# Patient Record
Sex: Female | Born: 1955 | Race: Asian | Hispanic: No | Smoking: Never smoker
Health system: Southern US, Community
[De-identification: ages and names within clinical notes are randomized; demographics above are authoritative.]

## PROBLEM LIST (undated history)

## (undated) DIAGNOSIS — R0602 Shortness of breath: Secondary | ICD-10-CM

## (undated) DIAGNOSIS — K2970 Gastritis, unspecified, without bleeding: Secondary | ICD-10-CM

## (undated) DIAGNOSIS — K219 Gastro-esophageal reflux disease without esophagitis: Secondary | ICD-10-CM

## (undated) DIAGNOSIS — E663 Overweight: Secondary | ICD-10-CM

## (undated) DIAGNOSIS — T7840XA Allergy, unspecified, initial encounter: Secondary | ICD-10-CM

## (undated) DIAGNOSIS — R079 Chest pain, unspecified: Secondary | ICD-10-CM

## (undated) DIAGNOSIS — D696 Thrombocytopenia, unspecified: Secondary | ICD-10-CM

## (undated) HISTORY — DX: Chest pain, unspecified: R07.9

## (undated) HISTORY — DX: Gastro-esophageal reflux disease without esophagitis: K21.9

## (undated) HISTORY — DX: Overweight: E66.3

## (undated) HISTORY — DX: Shortness of breath: R06.02

## (undated) HISTORY — DX: Thrombocytopenia, unspecified: D69.6

## (undated) HISTORY — DX: Allergy, unspecified, initial encounter: T78.40XA

## (undated) HISTORY — DX: Gastritis, unspecified, without bleeding: K29.70

---

## 1992-03-27 HISTORY — PX: TUBAL LIGATION: SHX77

## 2002-11-06 ENCOUNTER — Encounter: Payer: Self-pay | Admitting: Family Medicine

## 2002-11-06 ENCOUNTER — Encounter: Admission: RE | Admit: 2002-11-06 | Discharge: 2002-11-06 | Payer: Self-pay | Admitting: Family Medicine

## 2010-07-22 ENCOUNTER — Emergency Department (HOSPITAL_COMMUNITY): Payer: BC Managed Care – PPO

## 2010-07-22 ENCOUNTER — Emergency Department (HOSPITAL_COMMUNITY)
Admission: EM | Admit: 2010-07-22 | Discharge: 2010-07-23 | Disposition: A | Discharge status: Home / Self Care | Payer: BC Managed Care – PPO | Attending: Emergency Medicine | Admitting: Emergency Medicine

## 2010-07-22 DIAGNOSIS — R5381 Other malaise: Secondary | ICD-10-CM | POA: Insufficient documentation

## 2010-07-22 DIAGNOSIS — R630 Anorexia: Secondary | ICD-10-CM | POA: Insufficient documentation

## 2010-07-22 DIAGNOSIS — R0609 Other forms of dyspnea: Secondary | ICD-10-CM | POA: Insufficient documentation

## 2010-07-22 DIAGNOSIS — R5383 Other fatigue: Secondary | ICD-10-CM | POA: Insufficient documentation

## 2010-07-22 DIAGNOSIS — R002 Palpitations: Secondary | ICD-10-CM | POA: Insufficient documentation

## 2010-07-22 DIAGNOSIS — R0989 Other specified symptoms and signs involving the circulatory and respiratory systems: Secondary | ICD-10-CM | POA: Insufficient documentation

## 2010-07-23 LAB — D-DIMER, QUANTITATIVE: D-Dimer, Quant: 0.29 ug/mL-FEU (ref 0.00–0.48)

## 2010-07-23 LAB — POCT CARDIAC MARKERS
CKMB, poc: <1 ng/mL — ABNORMAL LOW (ref 1.0–8.0)
Troponin i, poc: <0.05 ng/mL (ref 0.00–0.09)

## 2010-07-23 LAB — URINALYSIS, ROUTINE W REFLEX MICROSCOPIC
Bilirubin Urine: NEGATIVE
Ketones, ur: NEGATIVE mg/dL
Nitrite: NEGATIVE
Urobilinogen, UA: 0.2 mg/dL (ref 0.0–1.0)

## 2010-07-23 LAB — COMPREHENSIVE METABOLIC PANEL
ALT: 29 U/L (ref 0–35)
Alkaline Phosphatase: 68 U/L (ref 39–117)
BUN: 20 mg/dL (ref 6–23)
Calcium: 9.4 mg/dL (ref 8.4–10.5)
Chloride: 104 mEq/L (ref 96–112)
GFR calc Af Amer: >60 mL/min (ref >60)
GFR calc non Af Amer: >60 mL/min (ref >60)
Glucose, Bld: 104 mg/dL — ABNORMAL HIGH (ref 70–99)

## 2010-07-23 LAB — DIFFERENTIAL
Basophils Relative: 0 % (ref 0–1)
Eosinophils Absolute: 0.2 10*3/uL (ref 0.0–0.7)
Eosinophils Relative: 3 % (ref 0–5)
Monocytes Absolute: 0.7 10*3/uL (ref 0.1–1.0)
Neutro Abs: 3.2 10*3/uL (ref 1.7–7.7)

## 2010-07-23 LAB — CBC
HCT: 43.2 % (ref 36.0–46.0)
Hemoglobin: 14.6 g/dL (ref 12.0–15.0)
MCHC: 33.8 g/dL (ref 30.0–36.0)
Platelets: 139 10*3/uL — ABNORMAL LOW (ref 150–400)
RDW: 13.1 % (ref 11.5–15.5)

## 2011-02-13 ENCOUNTER — Other Ambulatory Visit: Payer: Self-pay | Admitting: Specialist

## 2011-02-13 ENCOUNTER — Ambulatory Visit
Admission: RE | Admit: 2011-02-13 | Discharge: 2011-02-13 | Disposition: A | Discharge status: Home / Self Care | Payer: BC Managed Care – PPO | Source: Ambulatory Visit | Attending: Specialist | Admitting: Specialist

## 2011-02-13 DIAGNOSIS — M79673 Pain in unspecified foot: Secondary | ICD-10-CM

## 2011-08-30 ENCOUNTER — Ambulatory Visit (INDEPENDENT_AMBULATORY_CARE_PROVIDER_SITE_OTHER): Payer: BC Managed Care – PPO | Admitting: Family Medicine

## 2011-08-30 VITALS — BP 121/78 | HR 70 | Temp 97.2°F | Resp 16 | Ht 59.0 in | Wt 121.4 lb

## 2011-08-30 DIAGNOSIS — R1084 Generalized abdominal pain: Secondary | ICD-10-CM

## 2011-08-30 DIAGNOSIS — R109 Unspecified abdominal pain: Secondary | ICD-10-CM

## 2011-08-30 LAB — COMPREHENSIVE METABOLIC PANEL
Alkaline Phosphatase: 105 U/L (ref 39–117)
Creat: 0.54 mg/dL (ref 0.50–1.10)
Glucose, Bld: 96 mg/dL (ref 70–99)
Sodium: 143 mEq/L (ref 135–145)
Total Bilirubin: 0.4 mg/dL (ref 0.3–1.2)
Total Protein: 7.7 g/dL (ref 6.0–8.3)

## 2011-08-30 LAB — POCT CBC
HCT, POC: 47.3 % (ref 37.7–47.9)
Lymph, poc: 3.3 (ref 0.6–3.4)
MCH, POC: 26.8 pg — AB (ref 27–31.2)
MCHC: 31.5 g/dL — AB (ref 31.8–35.4)
MPV: 11.5 fL (ref 0–99.8)
POC Granulocyte: 3.4 (ref 2–6.9)
POC LYMPH PERCENT: 45.3 %L (ref 10–50)
POC MID %: 7.4 %M (ref 0–12)
RDW, POC: 13.5 %
WBC: 7.2 10*3/uL (ref 4.6–10.2)

## 2011-08-30 MED ORDER — SUCRALFATE 1 G PO TABS: ORAL_TABLET | ORAL | Status: DC

## 2011-08-30 MED ORDER — PANTOPRAZOLE SODIUM 40 MG PO TBEC: 40.0000 mg | DELAYED_RELEASE_TABLET | Freq: Every day | ORAL | Status: DC

## 2011-08-30 NOTE — Progress Notes (Signed)
  Subjective:    Patient ID: Beth Brooks, female    DOB: 1955-05-16, 56 y.o.   MRN: [id number]  HPI 56 yo female here with abdominal pain. 1 week of heartburn/epigastric pain.  Burning into chest and throat.  Has also had pain with eating for about a month.  Thinks she has lost 10 pounds.  Eating less because it hurts to eat - pain is epigastric.  Stomach growling a lot too.  Has tried zantac for about a week and hasn't noticed much improvement.  Not hurting right now but always hurts after she eats.  No bowel abnormalities.  No fever.  Occ cough - from itchy throat.  No dysuria.   Has had a history of heartburn before.  THis feels similar to previous episodes but doesn't remember what she was treated with.     Review of Systems Negative except as per HPI     Objective:   Physical Exam  Constitutional: Vital signs are normal. She appears well-developed and well-nourished. She is active.  Cardiovascular: Normal rate, regular rhythm, normal heart sounds and normal pulses.   Pulmonary/Chest: Effort normal and breath sounds normal.  Abdominal: Soft. Normal appearance and bowel sounds are normal. She exhibits no distension and no mass. There is no hepatosplenomegaly. There is no tenderness. There is no rigidity, no rebound, no guarding, no CVA tenderness, no tenderness at McBurney's point and negative Murphy's sign. No hernia.  Neurological: She is alert.    Results for orders placed in visit on 08/30/11  POCT CBC      Component Value Range   WBC 7.2  4.6 - 10.2 (K/uL)   Lymph, poc 3.3  0.6 - 3.4    POC LYMPH PERCENT 45.3  10 - 50 (%L)   MID (cbc) 0.5  0 - 0.9    POC MID % 7.4  0 - 12 (%M)   POC Granulocyte 3.4  2 - 6.9    Granulocyte percent 47.3  37 - 80 (%G)   RBC 5.56 (*) 4.04 - 5.48 (M/uL)   Hemoglobin 14.9  12.2 - 16.2 (g/dL)   HCT, POC 40.9  81.1 - 47.9 (%)   MCV 85.0  80 - 97 (fL)   MCH, POC 26.8 (*) 27 - 31.2 (pg)   MCHC 31.5 (*) 31.8 - 35.4 (g/dL)   RDW, POC 91.4     Platelet  Count, POC 157  142 - 424 (K/uL)   MPV 11.5  0 - 99.8 (fL)         Assessment & Plan:  Gastritis/heartburn affecting appetite.  CBC normal.  CMET pending to eval LFT's.  Start protonix, carafate.  If no improvementin 5-7 days, return for recheck.

## 2011-09-29 ENCOUNTER — Other Ambulatory Visit: Payer: Self-pay | Admitting: Family Medicine

## 2011-12-17 ENCOUNTER — Ambulatory Visit: Payer: BC Managed Care – PPO

## 2011-12-17 ENCOUNTER — Ambulatory Visit (INDEPENDENT_AMBULATORY_CARE_PROVIDER_SITE_OTHER): Payer: BC Managed Care – PPO | Admitting: Family Medicine

## 2011-12-17 VITALS — BP 130/73 | HR 79 | Temp 98.6°F | Resp 18 | Ht 60.0 in | Wt 120.0 lb

## 2011-12-17 DIAGNOSIS — Z789 Other specified health status: Secondary | ICD-10-CM

## 2011-12-17 DIAGNOSIS — R079 Chest pain, unspecified: Secondary | ICD-10-CM

## 2011-12-17 DIAGNOSIS — Z609 Problem related to social environment, unspecified: Secondary | ICD-10-CM

## 2011-12-17 DIAGNOSIS — R109 Unspecified abdominal pain: Secondary | ICD-10-CM

## 2011-12-17 DIAGNOSIS — K219 Gastro-esophageal reflux disease without esophagitis: Secondary | ICD-10-CM

## 2011-12-17 LAB — COMPREHENSIVE METABOLIC PANEL
ALT: 14 U/L (ref 0–35)
AST: 16 U/L (ref 0–37)
Alkaline Phosphatase: 73 U/L (ref 39–117)
CO2: 29 mEq/L (ref 19–32)
Creat: 0.47 mg/dL — ABNORMAL LOW (ref 0.50–1.10)
Sodium: 141 mEq/L (ref 135–145)
Total Bilirubin: 0.7 mg/dL (ref 0.3–1.2)
Total Protein: 7.4 g/dL (ref 6.0–8.3)

## 2011-12-17 LAB — POCT CBC
Hemoglobin: 15.1 g/dL (ref 12.2–16.2)
Lymph, poc: 3 (ref 0.6–3.4)
MCH, POC: 26.7 pg — AB (ref 27–31.2)
MCHC: 30.4 g/dL — AB (ref 31.8–35.4)
MID (cbc): 0.5 (ref 0–0.9)
MPV: 10.9 fL (ref 0–99.8)
POC Granulocyte: 3.2 (ref 2–6.9)
POC MID %: 6.9 %M (ref 0–12)
Platelet Count, POC: 156 10*3/uL (ref 142–424)
RDW, POC: 14.6 %
WBC: 6.7 10*3/uL (ref 4.6–10.2)

## 2011-12-17 MED ORDER — PANTOPRAZOLE SODIUM 40 MG PO TBEC: 40.0000 mg | DELAYED_RELEASE_TABLET | Freq: Every day | ORAL | Status: DC

## 2011-12-17 MED ORDER — GI COCKTAIL ~~LOC~~
30.0000 mL | Freq: Once | ORAL | Status: AC
  Administered 2011-12-17: 30 mL via ORAL

## 2011-12-17 NOTE — Progress Notes (Signed)
[facility] [address] [place] [address] [phone]  Date:  12/17/2011   Name:  Beth Brooks   DOB:  [birth year]   MRN:  [id number]  PCP:  No primary provider on file.    Chief Complaint: Abdominal Pain   History of Present Illness:  Beth Brooks is a 56 y.o. very pleasant female patient who presents with the following:  She was here in June and treated for GERD. She is not currently taking her protonix which was prescribed at that time.  Beth Brooks is here today with pain in her abdomen after eating, as well as chest pains under her left arm.  The chest pains have been present for about 3 weeks.  These pains come and go- they are worse with deep breaths and with eating.  The pains are not associated with exertion After she eats she feels "stong pains" up into her chest and esophagus.   Did not eat yet this morning.   No nausea or vomiting, no diarrhea.    She weighs the same as she did in June of this year, but they think she has lost about 10 pounds over the last year or so.  This seems to be due to eating less.    Beth Brooks has not had a recent physical, and per her daughter's knowledge has never had a mammogram or colonoscopy, and has not seen a doctor for any physical exam or preventative services in the recent past.  She does not speak English, but her daughter is able to help [place] interpret today.  Her daughter is interested in her mother having a complete physical and "test for everything" specifically an abdominal ultrasound.    Menopausal for 2 years.    There is no problem list on file for this patient.   No past medical history on file.  No past surgical history on file.  History  Substance Use Topics  . Smoking status: Never Smoker   . Smokeless tobacco: Not on file  . Alcohol Use: Not on file    No family history on file.  No Known Allergies  Medication list has been reviewed and updated.  Current Outpatient Prescriptions on File Prior to Visit    Medication Sig Dispense Refill  . pantoprazole (PROTONIX) 40 MG tablet Take 1 tablet (40 mg total) by mouth daily.  30 tablet  3  . sucralfate (CARAFATE) 1 G tablet TAKE 1 TAB WITH MEALS AND 1 AT BEDTIME (TOTAL 4 TIMES DAILY) UNTIL GONE.  20 tablet  0    Review of Systems:  As per HPI- otherwise negative.   Physical Examination: Filed Vitals:   12/17/11 1008  BP: 130/73  Pulse: 79  Temp: 98.6 F (37 C)  Resp: 18   Filed Vitals:   12/17/11 1008  Height: 5' (1.524 m)  Weight: 120 lb (54.432 kg)   Body mass index is 23.44 kg/(m^2). Ideal Body Weight: Weight in (lb) to have BMI = 25: 127.7   GEN: WDWN, NAD, Non-toxic, A & O x 3, looks well HEENT: Atraumatic, Normocephalic. Neck supple. No masses, No LAD. Ears and Nose: No external deformity. CV: RRR, No M/G/R. No JVD. No thrill. No extra heart sounds. Cannot reproduce CP by pressing on her chest wall PULM: CTA B, no wheezes, crackles, rhonchi. No retractions. No resp. distress. No accessory muscle use. ABD: S, NT, ND, +BS. No rebound. No HSM. EXTR: No c/c/e NEURO Normal gait.  PSYCH: Normally interactive.  Conversant. Not depressed or anxious appearing.  Calm demeanor.   [facility] reading (PRIMARY) by  Dr. Patsy Lager.  Chest x-ray: negative  EKG: compared to old EKG on file.  No acute change, no ST elevation or depression.  No concerning findings.    GI cocktail: resolved her chest pain while she was in the office today.    Results for orders placed in visit on 12/17/11  POCT CBC      Component Value Range   WBC 6.7  4.6 - 10.2 K/uL   Lymph, poc 3.0  0.6 - 3.4   POC LYMPH PERCENT 44.8  10 - 50 %L   MID (cbc) 0.5  0 - 0.9   POC MID % 6.9  0 - 12 %M   POC Granulocyte 3.2  2 - 6.9   Granulocyte percent 48.3  37 - 80 %G   RBC 5.65 (*) 4.04 - 5.48 M/uL   Hemoglobin 15.1  12.2 - 16.2 g/dL   HCT, POC 16.1 (*) 09.6 - 47.9 %   MCV 87.9  80 - 97 fL   MCH, POC 26.7 (*) 27 - 31.2 pg   MCHC 30.4 (*) 31.8 - 35.4 g/dL   RDW, POC  04.5     Platelet Count, POC 156  142 - 424 K/uL   MPV 10.9  0 - 99.8 fL   increased time needed for this exam due to language barrier.   Assessment and Plan: 1. Abdominal  pain, other specified site  POCT CBC, Comprehensive metabolic panel, gi cocktail (Maalox,Lidocaine,Donnatal), US Abdomen Complete  2. Chest pain  EKG 12-Lead, DG Chest 2 View, gi cocktail (Maalox,Lidocaine,Donnatal)  3. Language Barrier    4. GERD (gastroesophageal reflux disease)  pantoprazole (PROTONIX) 40 MG tablet   Beth Brooks is here with a few symptoms today, but GERD is the likely cause for both her abdominal pain and CP.  Her CP resolved in clinic today after a GI cocktail.  As she has had these abdominal pains for some time and has lost weight, an ultrasound is a reasonable next step.  Will arrange for this test, and also re-start her protonix today.  They are to let me know if she is worse or not getting better with the protonix.  Also explained to her daughter how to arrange for Beth Brooks to have preventative services such as a mammogram and colonoscopy performed.    Abbe Amsterdam, MD

## 2011-12-17 NOTE — Patient Instructions (Addendum)
Take the protonix for heartburn/ abdominal pain.  You can take this daily as needed.    We will schedule you for an ultrasound of your belly and will be in touch with this appointment.    You also need to have a mammogram, colonoscopy, and a complete physical.    You can schedule your own mammogram with several facilities in town such as the [facility] and most OB- GYN offices  You can also schedule your own colonoscopy through a GI office such as [facility] or [facility]

## 2011-12-18 ENCOUNTER — Other Ambulatory Visit: Payer: Self-pay | Admitting: Family Medicine

## 2011-12-18 ENCOUNTER — Other Ambulatory Visit: Payer: Self-pay | Admitting: Radiology

## 2011-12-18 ENCOUNTER — Encounter: Payer: Self-pay | Admitting: Internal Medicine

## 2011-12-18 DIAGNOSIS — K219 Gastro-esophageal reflux disease without esophagitis: Secondary | ICD-10-CM

## 2011-12-18 MED ORDER — PANTOPRAZOLE SODIUM 40 MG PO TBEC: 40.0000 mg | DELAYED_RELEASE_TABLET | Freq: Every day | ORAL | Status: DC

## 2011-12-18 MED ORDER — SUCRALFATE 1 G PO TABS: 1.0000 g | ORAL_TABLET | Freq: Four times a day (QID) | ORAL | Status: DC

## 2011-12-19 ENCOUNTER — Encounter: Payer: Self-pay | Admitting: Family Medicine

## 2011-12-20 ENCOUNTER — Encounter: Payer: Self-pay | Admitting: Internal Medicine

## 2011-12-21 ENCOUNTER — Ambulatory Visit
Admission: RE | Admit: 2011-12-21 | Discharge: 2011-12-21 | Disposition: A | Discharge status: Home / Self Care | Payer: BC Managed Care – PPO | Source: Ambulatory Visit | Attending: Family Medicine | Admitting: Family Medicine

## 2011-12-21 DIAGNOSIS — R109 Unspecified abdominal pain: Secondary | ICD-10-CM

## 2012-01-01 ENCOUNTER — Encounter: Payer: BC Managed Care – PPO | Admitting: Internal Medicine

## 2012-01-15 ENCOUNTER — Encounter: Payer: Self-pay | Admitting: Internal Medicine

## 2012-01-17 ENCOUNTER — Other Ambulatory Visit: Payer: Self-pay | Admitting: Gastroenterology

## 2012-01-17 ENCOUNTER — Ambulatory Visit (INDEPENDENT_AMBULATORY_CARE_PROVIDER_SITE_OTHER): Payer: BC Managed Care – PPO | Admitting: Internal Medicine

## 2012-01-17 ENCOUNTER — Encounter: Payer: Self-pay | Admitting: Internal Medicine

## 2012-01-17 VITALS — BP 118/80 | HR 56 | Ht <= 58 in | Wt 121.6 lb

## 2012-01-17 DIAGNOSIS — K219 Gastro-esophageal reflux disease without esophagitis: Secondary | ICD-10-CM

## 2012-01-17 DIAGNOSIS — Z789 Other specified health status: Secondary | ICD-10-CM

## 2012-01-17 DIAGNOSIS — K3189 Other diseases of stomach and duodenum: Secondary | ICD-10-CM

## 2012-01-17 DIAGNOSIS — Z1211 Encounter for screening for malignant neoplasm of colon: Secondary | ICD-10-CM

## 2012-01-17 DIAGNOSIS — R1013 Epigastric pain: Secondary | ICD-10-CM | POA: Insufficient documentation

## 2012-01-17 MED ORDER — PANTOPRAZOLE SODIUM 40 MG PO TBEC: 40.0000 mg | DELAYED_RELEASE_TABLET | Freq: Every day | ORAL | Status: DC

## 2012-01-17 NOTE — Progress Notes (Signed)
Patient ID: Beth Brooks, female   DOB: 1955-12-28, 56 y.o.   MRN: [id number]  SUBJECTIVE: HPI Beth Brooks is a 56 yo [place] female with little past medical history who is seen in consultation at the request of Dr. Patsy Lager for evaluation of epigastric abdominal pain and GERD.  The patient does not speak English and is accompanied today by her daughter who helps translate.  They report a history of epigastric abdominal pain over the last 2-3 months. This pain is specifically worse after eating and she feels a burning radiation into her mid chest. The pain starts fairly quickly after eating and can last for several hours. She has noted some mild nausea but no vomiting. Her appetite has been somewhat decreased with this issue and she does feel full more quickly. They report an approximate 10 pound weight loss over the last few months. She reports occasionally using ibuprofen but has stopped this now. She was started on pantoprazole 40 mg daily about 2 months ago which has helped but not completely. It seems that she was having more constant pain which the pantoprazole helped, but she still is having pain after eating. She denies change in bowel habits including no blood in her stool nor melena.  No fevers or chills. No known family history of GI illness.  No prior GI procedures  Review of Systems  As per history of present illness, otherwise negative   Past Medical History  Diagnosis Date  . GERD (gastroesophageal reflux disease)     Current Outpatient Prescriptions  Medication Sig Dispense Refill  . pantoprazole (PROTONIX) 40 MG tablet Take 1 tablet (40 mg total) by mouth daily.  30 tablet  3    No Known Allergies  Family History  Problem Relation Age of Onset  . Colon cancer Neg Hx   . Gestational diabetes Daughter     History  Substance Use Topics  . Smoking status: Never Smoker   . Smokeless tobacco: Never Used  . Alcohol Use: No    OBJECTIVE: BP 118/80  Pulse 56  Ht 4\' 9"  (1.448  m)  Wt 121 lb 9.6 oz (55.157 kg)  BMI 26.31 kg/m2 Constitutional: Well-developed and well-nourished. No distress. HEENT: Normocephalic and atraumatic. Oropharynx is clear and moist. No oropharyngeal exudate. Conjunctivae are normal. No scleral icterus. Neck: Neck supple. Trachea midline. Cardiovascular: Normal rate, regular rhythm and intact distal pulses. No M/R/G Pulmonary/chest: Effort normal and breath sounds normal. No wheezing, rales or rhonchi. Abdominal: Soft, nontender, nondistended. Bowel sounds active throughout. There are no masses palpable. No hepatosplenomegaly. Extremities: no clubbing, cyanosis, or edema Lymphadenopathy: No cervical adenopathy noted. Neurological: Alert and oriented to person place and time. Skin: Skin is warm and dry. No rashes noted. Psychiatric: Normal mood and affect. Behavior is normal.  Labs and Imaging -- CBC    Component Value Date/Time   WBC 6.7 12/17/2011 1131   WBC 7.8 07/23/2010 0001   RBC 5.65* 12/17/2011 1131   RBC 5.20* 07/23/2010 0001   HGB 15.1 12/17/2011 1131   HGB 14.6 07/23/2010 0001   HCT 49.7* 12/17/2011 1131   HCT 43.2 07/23/2010 0001   PLT 139* 07/23/2010 0001   MCV 87.9 12/17/2011 1131   MCV 83.1 07/23/2010 0001   MCH 26.7* 12/17/2011 1131   MCH 28.1 07/23/2010 0001   MCHC 30.4* 12/17/2011 1131   MCHC 33.8 07/23/2010 0001   RDW 13.1 07/23/2010 0001   LYMPHSABS 3.7 07/23/2010 0001   MONOABS 0.7 07/23/2010 0001   EOSABS 0.2  07/23/2010 0001   BASOSABS 0.0 07/23/2010 0001    CMP     Component Value Date/Time   NA 141 12/17/2011 1124   K 3.8 12/17/2011 1124   CL 104 12/17/2011 1124   CO2 29 12/17/2011 1124   GLUCOSE 97 12/17/2011 1124   BUN 13 12/17/2011 1124   CREATININE 0.47* 12/17/2011 1124   CREATININE 0.56 07/23/2010 0001   CALCIUM 9.6 12/17/2011 1124   PROT 7.4 12/17/2011 1124   ALBUMIN 4.4 12/17/2011 1124   AST 16 12/17/2011 1124   ALT 14 12/17/2011 1124   ALKPHOS 73 12/17/2011 1124   BILITOT 0.7 12/17/2011 1124   GFRNONAA >60  07/23/2010 0001   GFRAA  Value: >60        The eGFR has been calculated using the MDRD equation. This calculation has not been validated in all clinical situations. eGFR's persistently <60 mL/min signify possible Chronic Kidney Disease. 07/23/2010 0001   Clinical Data:  Abdominal pain and weight loss.   COMPLETE ABDOMINAL ULTRASOUND -- 12/17/2011   Comparison:  None.   Findings:   Gallbladder:  No gallstones, gallbladder wall thickening, or pericholecystic fluid. Negative sonographic Murphy's sign.   Common bile duct:  Normal.  5.3 mm in diameter.   Liver:  Normal.   IVC:  Normal.   Pancreas:  Normal.   Spleen:  Normal.  6.6 cm in length.   Right Kidney:  Normal.  9.6 cm in length.   Left Kidney:  Normal.  9.9 cm in length.   Abdominal aorta:  Normal.  2.0 cm maximum diameter.   IMPRESSION: Negative abdominal ultrasound.   ASSESSMENT AND PLAN:  56 yo [place] female with little past medical history who is seen in consultation at the request of Dr. Patsy Lager for evaluation of epigastric abdominal pain and GERD.  1.  Epigastric pain/dyspepsia/GERD -- the patient's pain seems acid peptic in nature and may relate to gastroduodenitis/ulcer disease or perhaps H. pylori. Her abdominal ultrasound is reviewed and reassuring from a biliary standpoint.  It seems that the once daily PPI has helped, and I recommended that she increase this to twice daily for now. I've also recommended upper endoscopy for further evaluation. We discussed the test today and she is agreeable to proceed, however I am somewhat concerned about the language barrier. We will bring her back for a pre-visit prior to her procedure with an interpreter to ensure there is no confusion and that she is clear as it pertains to the risks and benefits of this procedure. I've asked that she continue to avoid NSAIDs until we know what is causing her symptoms.  2.  CRC screening -- she has never had colonoscopy, and I recommended  this for colorectal cancer screening. She is average risk. Again she will have a previsit to discussed this test before proceeding with an interpreter.

## 2012-01-17 NOTE — Patient Instructions (Addendum)
You have been scheduled for a pre-visit for 01/22/2012 @ 9am to go over your instructions for your Endoscopy/Colonoscopy on 01/31/2012 @ 1:30pm

## 2012-01-22 ENCOUNTER — Ambulatory Visit (AMBULATORY_SURGERY_CENTER): Payer: BC Managed Care – PPO | Admitting: Internal Medicine

## 2012-01-22 DIAGNOSIS — K219 Gastro-esophageal reflux disease without esophagitis: Secondary | ICD-10-CM

## 2012-01-22 DIAGNOSIS — Z1211 Encounter for screening for malignant neoplasm of colon: Secondary | ICD-10-CM

## 2012-01-22 DIAGNOSIS — R1013 Epigastric pain: Secondary | ICD-10-CM

## 2012-01-22 MED ORDER — MOVIPREP 100 G PO SOLR: ORAL | Status: DC

## 2012-01-23 ENCOUNTER — Encounter: Payer: Self-pay | Admitting: Internal Medicine

## 2012-01-25 ENCOUNTER — Encounter: Payer: Self-pay | Admitting: Internal Medicine

## 2012-01-28 ENCOUNTER — Ambulatory Visit: Payer: BC Managed Care – PPO

## 2012-01-28 ENCOUNTER — Ambulatory Visit (INDEPENDENT_AMBULATORY_CARE_PROVIDER_SITE_OTHER): Payer: BC Managed Care – PPO | Admitting: Internal Medicine

## 2012-01-28 VITALS — BP 116/74 | HR 77 | Temp 99.5°F | Resp 16 | Ht 59.5 in | Wt 122.0 lb

## 2012-01-28 DIAGNOSIS — R05 Cough: Secondary | ICD-10-CM

## 2012-01-28 MED ORDER — AZITHROMYCIN 500 MG PO TABS: 500.0000 mg | ORAL_TABLET | Freq: Every day | ORAL | Status: DC

## 2012-01-28 MED ORDER — HYDROCODONE-HOMATROPINE 5-1.5 MG/5ML PO SYRP: 5.0000 mL | ORAL_SOLUTION | Freq: Four times a day (QID) | ORAL | Status: AC | PRN

## 2012-01-28 NOTE — Progress Notes (Signed)
  Subjective:    Patient ID: Beth Brooks, female    DOB: [birth year], 56 y.o.   MRN: [id number]  HPI History of a cough for the last 10 days that is occasionally productive. This has interfered with sleep and caused reduced daytime activity. There is no definite fever or chills according to the daughter who is translating. No recent weight loss. No nasal congestion. No sore throat . Recent history leading up to colonoscopy scheduled for this Wednesday is documented in chart.   Review of Systems No dyspnea on exertion or shortness of breath No orthopnea No palpitations or chest pain No history of underlying pulmonary illness    Objective:   Physical Exam Filed Vitals:   01/28/12 0904  BP: 116/74  Pulse: 77  Temp: 99.5 F (37.5 C)  Resp: 16  Pulse ox 98% Weight 122 pounds Pupils equal round reactive to light and accommodation/no conjunctivitis TMs clear Nares clear Oropharynx clear/no cervical nodes Lungs with rhonchi at both bases posteriorly that clear with deep breathing No wheezing with forced expiration Heart regular without murmur       [facility] reading (PRIMARY) by  Dr. Josephina Gip infiltr   Assessment & Plan:  Problem #1 prolonged cough with no x-ray evidence of underlying disease or tuberculosis This suggests a viral bronchitis or a community-acquired infection such as mycoplasma In lieu of pending colonoscopy Will treat aggressively Meds ordered this encounter  Medications  . azithromycin (ZITHROMAX) 500 MG tablet    Sig: Take 1 tablet (500 mg total) by mouth daily.    Dispense:  3 tablet    Refill:  0  . HYDROcodone-homatropine (HYCODAN) 5-1.5 MG/5ML syrup    Sig: Take 5 mLs by mouth every 6 (six) hours as needed for cough.    Dispense:  120 mL    Refill:  0

## 2012-01-31 ENCOUNTER — Ambulatory Visit (AMBULATORY_SURGERY_CENTER): Payer: BC Managed Care – PPO | Admitting: Internal Medicine

## 2012-01-31 ENCOUNTER — Encounter: Payer: Self-pay | Admitting: Internal Medicine

## 2012-01-31 VITALS — BP 110/72 | HR 71 | Temp 97.6°F | Resp 17 | Ht 59.0 in | Wt 122.0 lb

## 2012-01-31 DIAGNOSIS — Z1211 Encounter for screening for malignant neoplasm of colon: Secondary | ICD-10-CM

## 2012-01-31 DIAGNOSIS — R1013 Epigastric pain: Secondary | ICD-10-CM

## 2012-01-31 DIAGNOSIS — K2980 Duodenitis without bleeding: Secondary | ICD-10-CM

## 2012-01-31 DIAGNOSIS — K2970 Gastritis, unspecified, without bleeding: Secondary | ICD-10-CM

## 2012-01-31 DIAGNOSIS — K2990 Gastroduodenitis, unspecified, without bleeding: Secondary | ICD-10-CM

## 2012-01-31 DIAGNOSIS — K219 Gastro-esophageal reflux disease without esophagitis: Secondary | ICD-10-CM

## 2012-01-31 MED ORDER — SODIUM CHLORIDE 0.9 % IV SOLN: 500.0000 mL | INTRAVENOUS | Status: DC

## 2012-01-31 NOTE — Progress Notes (Signed)
Patient did not experience any of the following events: a burn prior to discharge; a fall within the facility; wrong site/side/patient/procedure/implant event; or a hospital transfer or hospital admission upon discharge from the facility. (G8907) Patient did not have preoperative order for IV antibiotic SSI prophylaxis. (G8918)  

## 2012-01-31 NOTE — Op Note (Signed)
[facility] [address] [place], [address]   ENDOSCOPY PROCEDURE REPORT  PATIENT: Ariel, Laubenstein  MR#: [id number] BIRTHDATE: [birth year] , 55  yrs. old GENDER: Female ENDOSCOPIST: Beverley Fiedler, MD REFERRED BY:  Abbe Amsterdam PROCEDURE DATE:  01/31/2012 PROCEDURE:  EGD w/ biopsy for H.pylori ASA CLASS:     Class II INDICATIONS:  epigastric pain.   dyspepsia. MEDICATIONS: MAC sedation, administered by CRNA and propofol (Diprivan) 200mg  IV TOPICAL ANESTHETIC: Cetacaine Spray  DESCRIPTION OF PROCEDURE: After the risks benefits and alternatives of the procedure were thoroughly explained, informed consent was obtained.  The LB GIF-H180 [id number] endoscope was introduced through the mouth and advanced to the second portion of the duodenum. Without limitations.  The instrument was slowly withdrawn as the mucosa was fully examined.    ESOPHAGUS: The mucosa of the esophagus appeared normal.  STOMACH: Moderate nodular gastritis with erythema (inflammation) was found in the entire examined stomach, most pronounced in the fundus and gastric body.  Biopsies were taken in the proximal and distal stomach.  DUODENUM: Mild duodenal inflammation was found in the duodenal bulb. The duodenal mucosa showed no abnormalities in the 2nd part of the duodenum.  Retroflexed views revealed gastritis.     The scope was then withdrawn from the patient and the procedure completed.  COMPLICATIONS: There were no complications. ENDOSCOPIC IMPRESSION: 1.   The mucosa of the esophagus appeared normal 2.   Gastritis (inflammation) was found in the entire examined stomach; biopsies were taken 3.   Duodenal inflammation was found in the duodenal bulb 4.   The duodenal mucosa showed no abnormalities in the 2nd part of the duodenum  RECOMMENDATIONS: 1.  Await pathology results 2.  Continue current medications, including twice daily pantoprazole 40 mg for now 3.  Follow-up of helicobacter  pylori status, treat if indicated 4.  Avoid NSAIDS  eSigned:  Beverley Fiedler, MD 01/31/2012 2:04 PM    CC:The Patient

## 2012-01-31 NOTE — Op Note (Signed)
[facility] [address] [place], [address]   COLONOSCOPY PROCEDURE REPORT  PATIENT: Beth, Brooks  MR#: [id number] BIRTHDATE: 07-10-1955 , 55  yrs. old GENDER: Female ENDOSCOPIST: Beverley Fiedler, MD PROCEDURE DATE:  01/31/2012 PROCEDURE:   Colonoscopy, screening ASA CLASS:   Class II INDICATIONS:average risk screening and first colonoscopy. MEDICATIONS: MAC sedation, administered by CRNA and propofol (Diprivan) 100mg  IV  DESCRIPTION OF PROCEDURE:   After the risks benefits and alternatives of the procedure were thoroughly explained, informed consent was obtained.  A digital rectal exam revealed no rectal mass.   The LB CF-[id number]  endoscope was introduced through the anus and advanced to the terminal ileum which was intubated for a short distance. No adverse events experienced.   The quality of the prep was good, using MoviPrep  The instrument was then slowly withdrawn as the colon was fully examined.   COLON FINDINGS: The mucosa appeared normal in the terminal ileum. A normal appearing cecum, ileocecal valve, and appendiceal orifice were identified.  The ascending, hepatic flexure, transverse, splenic flexure, descending, sigmoid colon and rectum appeared unremarkable.  No polyps or cancers were seen.  Retroflexed views revealed internal hemorrhoids. The time to cecum=2 minutes 12 seconds.  Withdrawal time=8 minutes 09 seconds.  The scope was withdrawn and the procedure completed.  COMPLICATIONS: There were no complications.  ENDOSCOPIC IMPRESSION: 1.   Normal mucosa in the terminal ileum 2.   Normal colon 3.   Small internal hemorrhoids  RECOMMENDATIONS: You should continue to follow colorectal cancer screening guidelines for "routine risk" patients with a repeat colonoscopy in 10 years. There is no need for FOBT (stool) testing for at least 5 years.   eSigned:  Beverley Fiedler, MD 01/31/2012 2:06 PM   cc: The Patient and Abbe Amsterdam,  MD

## 2012-01-31 NOTE — Progress Notes (Signed)
INTERPRETER PRESENT DURING ENTIRE RECOVERY STAGE. PATIENT TO BATHROOM PRIOR TO DC.

## 2012-01-31 NOTE — Patient Instructions (Signed)
HIGH FIBER DIET WITH LIBERAL FLUID INTAKE. ADD METAMUCIL OR BENEFIBER. CONTINUE YOUR MEDICATIONS INCLUDING PANTOPRAZOLE 40 MG TWICE DAILY. AVOID ANTI INFLAMMATORIES: MEDICATIONS LIKE MOTRIN, ALEVE, IBUPROFEN.  YOU HAD AN ENDOSCOPIC PROCEDURE TODAY AT THE [facility]: Refer to the procedure report that was given to you for any specific questions about what was found during the examination.  If the procedure report does not answer your questions, please call your gastroenterologist to clarify.  If you requested that your care partner not be given the details of your procedure findings, then the procedure report has been included in a sealed envelope for you to review at your convenience later.  YOU SHOULD EXPECT: Some feelings of bloating in the abdomen. Passage of more gas than usual.  Walking can help get rid of the air that was put into your GI tract during the procedure and reduce the bloating. If you had a lower endoscopy (such as a colonoscopy or flexible sigmoidoscopy) you may notice spotting of blood in your stool or on the toilet paper. If you underwent a bowel prep for your procedure, then you may not have a normal bowel movement for a few days.  DIET: Your first meal following the procedure should be a light meal and then it is ok to progress to your normal diet.  A half-sandwich or bowl of soup is an example of a good first meal.  Heavy or fried foods are harder to digest and may make you feel nauseous or bloated.  Likewise meals heavy in dairy and vegetables can cause extra gas to form and this can also increase the bloating.  Drink plenty of fluids but you should avoid alcoholic beverages for 24 hours.  ACTIVITY: Your care partner should take you home directly after the procedure.  You should plan to take it easy, moving slowly for the rest of the day.  You can resume normal activity the day after the procedure however you should NOT DRIVE or use heavy machinery for 24 hours  (because of the sedation medicines used during the test).    SYMPTOMS TO REPORT IMMEDIATELY: A gastroenterologist can be reached at any hour.  During normal business hours, 8:30 AM to 5:00 PM Monday through Friday, call [phone].  After hours and on weekends, please call the GI answering service at [phone] who will take a message and have the physician on call contact you.   Following lower endoscopy (colonoscopy or flexible sigmoidoscopy):  Excessive amounts of blood in the stool  Significant tenderness or worsening of abdominal pains  Swelling of the abdomen that is new, acute  Fever of 100F or higher  Following upper endoscopy (EGD)  Vomiting of blood or coffee ground material  New chest pain or pain under the shoulder blades  Painful or persistently difficult swallowing  New shortness of breath  Fever of 100F or higher  Black, tarry-looking stools  FOLLOW UP: If any biopsies were taken you will be contacted by phone or by letter within the next 1-3 weeks.  Call your gastroenterologist if you have not heard about the biopsies in 3 weeks.  Our staff will call the home number listed on your records the next business day following your procedure to check on you and address any questions or concerns that you may have at that time regarding the information given to you following your procedure. This is a courtesy call and so if there is no answer at the home number and we have not  heard from you through the emergency physician on call, we will assume that you have returned to your regular daily activities without incident.  SIGNATURES/CONFIDENTIALITY: You and/or your care partner have signed paperwork which will be entered into your electronic medical record.  These signatures attest to the fact that that the information above on your After Visit Summary has been reviewed and is understood.  Full responsibility of the confidentiality of this discharge information lies with you  and/or your care-partner.

## 2012-02-01 ENCOUNTER — Telehealth: Payer: Self-pay | Admitting: *Deleted

## 2012-02-01 NOTE — Telephone Encounter (Signed)
  Follow up Call-  Call back number 01/31/2012  Post procedure Call Back phone  # [phone]  Permission to leave phone message Yes     No answer, left message to call if questions or concerns.

## 2012-02-02 ENCOUNTER — Other Ambulatory Visit: Payer: Self-pay | Admitting: Gastroenterology

## 2012-02-02 DIAGNOSIS — Z789 Other specified health status: Secondary | ICD-10-CM

## 2012-02-02 DIAGNOSIS — K219 Gastro-esophageal reflux disease without esophagitis: Secondary | ICD-10-CM

## 2012-02-02 MED ORDER — PANTOPRAZOLE SODIUM 40 MG PO TBEC: 40.0000 mg | DELAYED_RELEASE_TABLET | Freq: Every day | ORAL | Status: DC

## 2012-02-05 ENCOUNTER — Encounter: Payer: Self-pay | Admitting: Internal Medicine

## 2012-02-05 NOTE — Progress Notes (Signed)
Seen by RN.

## 2012-02-07 ENCOUNTER — Encounter: Payer: Self-pay | Admitting: Internal Medicine

## 2012-02-13 ENCOUNTER — Other Ambulatory Visit: Payer: Self-pay | Admitting: Internal Medicine

## 2012-02-14 ENCOUNTER — Other Ambulatory Visit: Payer: Self-pay | Admitting: *Deleted

## 2012-02-21 ENCOUNTER — Other Ambulatory Visit: Payer: Self-pay | Admitting: Internal Medicine

## 2012-02-21 NOTE — Telephone Encounter (Signed)
Ok to refil x 1 Then F/U if not well 2 weeks

## 2012-02-21 NOTE — Telephone Encounter (Signed)
Daughter called regarding a refill for Pt's Hycodan. She states that her mother is still coughing. Eileen Stanford

## 2012-02-22 ENCOUNTER — Telehealth: Payer: Self-pay

## 2012-02-22 MED ORDER — HYDROCODONE-HOMATROPINE 5-1.5 MG/5ML PO SYRP: ORAL_SOLUTION | ORAL | Status: DC

## 2012-02-22 NOTE — Addendum Note (Signed)
Addended by: Fidel Levy on: 02/22/2012 09:52 AM   Modules accepted: Orders

## 2012-02-22 NOTE — Telephone Encounter (Signed)
Hycodan called to [facility].

## 2012-02-22 NOTE — Telephone Encounter (Signed)
LMOM to CB. 

## 2012-02-22 NOTE — Telephone Encounter (Signed)
Done IN ERROR.  MBC

## 2012-03-06 ENCOUNTER — Encounter: Payer: Self-pay | Admitting: Internal Medicine

## 2012-03-08 ENCOUNTER — Encounter: Payer: Self-pay | Admitting: Internal Medicine

## 2012-03-08 ENCOUNTER — Ambulatory Visit (INDEPENDENT_AMBULATORY_CARE_PROVIDER_SITE_OTHER): Payer: BC Managed Care – PPO | Admitting: Internal Medicine

## 2012-03-08 VITALS — BP 90/62 | HR 72 | Ht 59.0 in | Wt 120.0 lb

## 2012-03-08 DIAGNOSIS — Z789 Other specified health status: Secondary | ICD-10-CM

## 2012-03-08 DIAGNOSIS — K2990 Gastroduodenitis, unspecified, without bleeding: Secondary | ICD-10-CM

## 2012-03-08 DIAGNOSIS — K219 Gastro-esophageal reflux disease without esophagitis: Secondary | ICD-10-CM

## 2012-03-08 DIAGNOSIS — Z609 Problem related to social environment, unspecified: Secondary | ICD-10-CM

## 2012-03-08 MED ORDER — SUCRALFATE 1 G PO TABS: 1.0000 g | ORAL_TABLET | Freq: Four times a day (QID) | ORAL | Status: DC

## 2012-03-08 MED ORDER — PANTOPRAZOLE SODIUM 40 MG PO TBEC: 40.0000 mg | DELAYED_RELEASE_TABLET | Freq: Every day | ORAL | Status: DC

## 2012-03-08 NOTE — Progress Notes (Signed)
  Subjective:    Patient ID: Beth Brooks, female    DOB: 1955/09/05, 56 y.o.   MRN: [id number]  HPI Beth Brooks is a 56 yo female  Who seen in followup for epigastric abdominal pain and GERD. She'll come by her daughter who helps with translation. She underwent upper endoscopy on 01/31/2012 which revealed a normal esophagus, gastroduodenitis, biopsies were negative for H. pylori, dysplasia or malignancy. She was started on pantoprazole 40 mg daily and returns to discuss her symptoms. Overall she is improved with less epigastric pain, and improvement heartburn. She does still feel occasional epigastric discomfort after eating a full meal. She's not having nausea or vomiting. She has run out of pantoprazole. No change in her bowel habits, melena or rectal bleeding.   Review of Systems As per history of present illness, otherwise negative  Current Medications, Allergies, Past Medical History, Past Surgical History, Family History and Social History were reviewed in [employer] record.     Objective:   Physical Exam BP 90/62  Pulse 72  Ht 4\' 11"  (1.499 m)  Wt 120 lb (54.432 kg)  BMI 24.24 kg/m2 Constitutional: Well-developed and well-nourished. No distress. HEENT: Normocephalic and atraumatic. Oropharynx is clear and moist. No oropharyngeal exudate. Conjunctivae are normal.  No scleral icterus. Neck: Neck supple. Trachea midline. Cardiovascular: Normal rate, regular rhythm and intact distal pulses. No M/R/G Pulmonary/chest: Effort normal and breath sounds normal. No wheezing, rales or rhonchi. Abdominal: Soft, nontender, nondistended. Bowel sounds active throughout. There are no masses palpable. No hepatosplenomegaly. Extremities: no clubbing, cyanosis, or edema Lymphadenopathy: No cervical adenopathy noted. Neurological: Alert and oriented to person place and time. Skin: Skin is warm and dry. No rashes noted. Psychiatric: Normal mood and affect. Behavior is normal.      Assessment & Plan:   56 yo female  Who seen in followup for epigastric abdominal pain and GERD.  1.  GERD/gastroduodenitis -- she had a favorable response to pantoprazole 40 mg daily and I have recommended she continue with this medication.  She is still having some epigastric pain with eating full meals, and I recommended smaller more frequent meals.  She did have an ultrasound performed in September which was negative for any biliary pathology.  I will add Carafate 1 g 4 times a day when necessary to see if this helps additionally with her pain, which is felt most likely secondary to her stomach and proximal duodenal inflammation.  This likely will improve further with pantoprazole.  Other considerations include gastroparesis, but they're a little risk factors for this. I will see her back in clinic in 3 months to reassess her symptoms ensure improvement.

## 2012-03-08 NOTE — Patient Instructions (Addendum)
We have sent the following medications to your pharmacy for you to pick up at your convenience: Protonix, Carafate; please take as directed.  Follow up with Dr. Rhea Belton in 3 months

## 2012-08-29 ENCOUNTER — Other Ambulatory Visit: Payer: Self-pay | Admitting: Internal Medicine

## 2013-03-06 ENCOUNTER — Other Ambulatory Visit: Payer: Self-pay | Admitting: Internal Medicine

## 2013-09-29 ENCOUNTER — Other Ambulatory Visit: Payer: Self-pay | Admitting: Internal Medicine

## 2014-03-21 ENCOUNTER — Other Ambulatory Visit: Payer: Self-pay | Admitting: Internal Medicine

## 2014-04-15 ENCOUNTER — Other Ambulatory Visit: Payer: Self-pay | Admitting: Internal Medicine

## 2014-04-25 ENCOUNTER — Ambulatory Visit (INDEPENDENT_AMBULATORY_CARE_PROVIDER_SITE_OTHER): Payer: BLUE CROSS/BLUE SHIELD | Admitting: Family Medicine

## 2014-04-25 ENCOUNTER — Other Ambulatory Visit: Payer: Self-pay | Admitting: Family Medicine

## 2014-04-25 VITALS — BP 122/82 | HR 65 | Temp 98.3°F | Resp 18

## 2014-04-25 DIAGNOSIS — J069 Acute upper respiratory infection, unspecified: Secondary | ICD-10-CM

## 2014-04-25 DIAGNOSIS — R05 Cough: Secondary | ICD-10-CM

## 2014-04-25 DIAGNOSIS — J208 Acute bronchitis due to other specified organisms: Secondary | ICD-10-CM

## 2014-04-25 DIAGNOSIS — R059 Cough, unspecified: Secondary | ICD-10-CM

## 2014-04-25 DIAGNOSIS — K219 Gastro-esophageal reflux disease without esophagitis: Secondary | ICD-10-CM

## 2014-04-25 MED ORDER — IPRATROPIUM BROMIDE 0.03 % NA SOLN: NASAL | Status: DC

## 2014-04-25 MED ORDER — HYDROCODONE-HOMATROPINE 5-1.5 MG/5ML PO SYRP: ORAL_SOLUTION | ORAL | Status: DC

## 2014-04-25 MED ORDER — PANTOPRAZOLE SODIUM 40 MG PO TBEC: DELAYED_RELEASE_TABLET | ORAL | Status: DC

## 2014-04-25 MED ORDER — BENZONATATE 100 MG PO CAPS: 100.0000 mg | ORAL_CAPSULE | Freq: Three times a day (TID) | ORAL | Status: DC | PRN

## 2014-04-25 NOTE — Progress Notes (Signed)
Subjective: 59 year old lady who is here with a four-day history of cough and congestion. She has a sore throat. She's been having chills. She does not smoke. She does have a long history of problems with her stomach. A couple of years ago she was on medicine for this with pantoprazole and spoke with 8. She has been out of that medicine and has more pain there and would like to be back on her medicine. She was last seen by gastroenterologist a little over 2 years ago.  Objective: Pleasant alert lady in no major distress. She does not speak much [place], but her nephew was here helping to interpret for her. Her TMs are normal. She feels stuffy in her years and itchy in the ears but not painful. Her throat is mildly red with no exudate. Neck supple without significant nodes. Chest is clear to auscultation. Heart regular without murmurs.  Assessment: Viral pharyngitis and bronchitis Dyspepsia  Plan: Put her back on the pantoprazole. Not sure that both that and saw Joanne Chars needed at this time. We'll just see how she does with the pantoprazole.  Treat the cough and postnasal drainage.  Return if not improving

## 2014-04-25 NOTE — Patient Instructions (Signed)
Drink lots of water and tea and get enough rest  Take the cough syrup 1 teaspoon every 4-6 hours primarily at night or on weekends.   Use the cough pills when she goes back to work because they will not make her sleepy.  Use the nose spray 1 or 2 sprays in each side of the nose up to 4 times daily if needed for head congestion  Take the pantoprazole 1 each day for the stomach  Return if not improving or if running high fever or getting worse rather than better.

## 2014-04-27 NOTE — Telephone Encounter (Signed)
Dr Linna Darner, you just saw pt for GERD, but this med hasn't been Rxd by [place] for a while. Do you want to RF? See note from pharm that this was on pt's DC papers.

## 2014-04-28 NOTE — Telephone Encounter (Signed)
Call patient: I would like to see how she does on just the pansoprasole and no sulcrafate for a while.  If not improved in the next 2 weeks call back and we will give the sulcrafate if necessary.

## 2014-04-28 NOTE — Telephone Encounter (Signed)
Notified daughter of Dr Hopper's instr's. She agreed to explain to pt and will CB if needed.

## 2014-06-02 ENCOUNTER — Encounter: Payer: Self-pay | Admitting: Family Medicine

## 2014-06-02 NOTE — Progress Notes (Signed)
Put the disability forms in the mail the patient. They were sent to the [facility] in error.

## 2014-08-25 IMAGING — US US ABDOMEN COMPLETE
1 series · 14 of 25 positions shown · non-contrast
Comparison: None.

CLINICAL DATA: Abdominal pain and weight loss.

COMPLETE ABDOMINAL ULTRASOUND

[Series 1: us abdomen complete · 0.19mm/px · 14 of 83 slices shown]
[im 1/83]
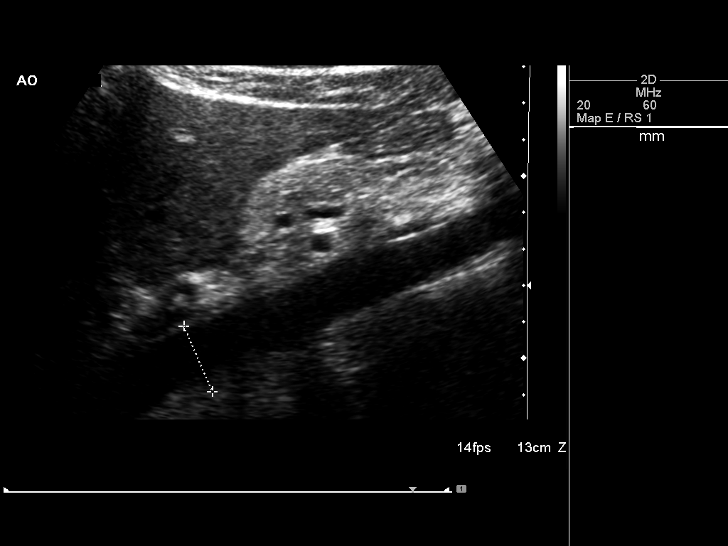
[im 7/83]
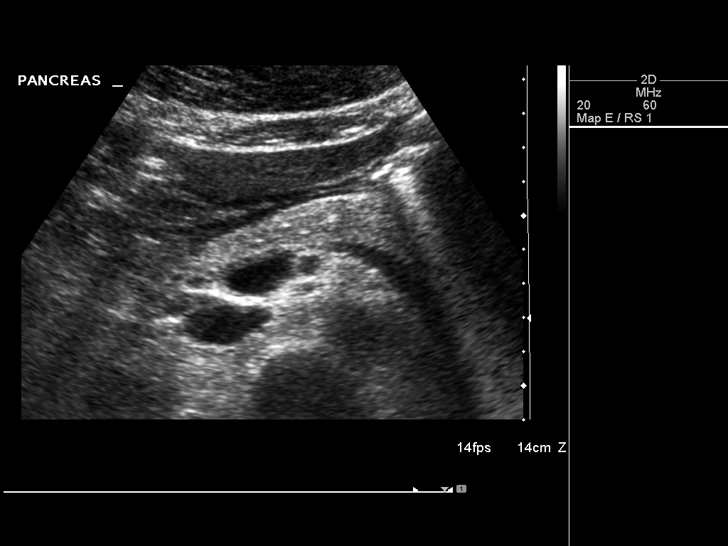
[im 14/83]
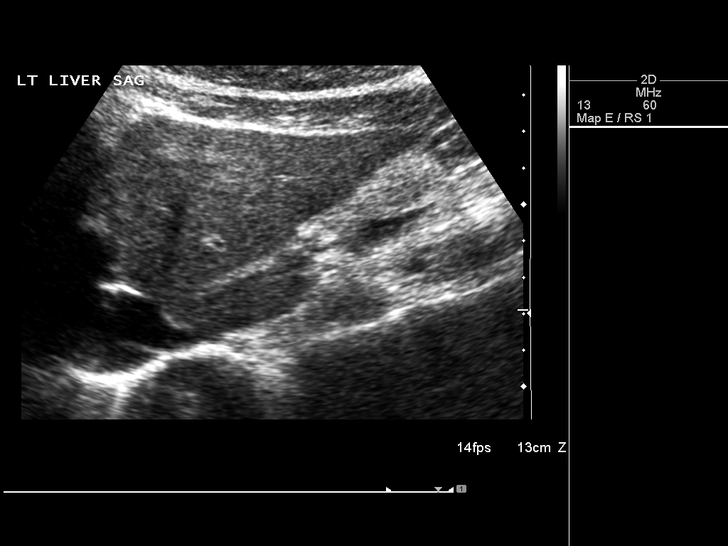
[im 21/83]
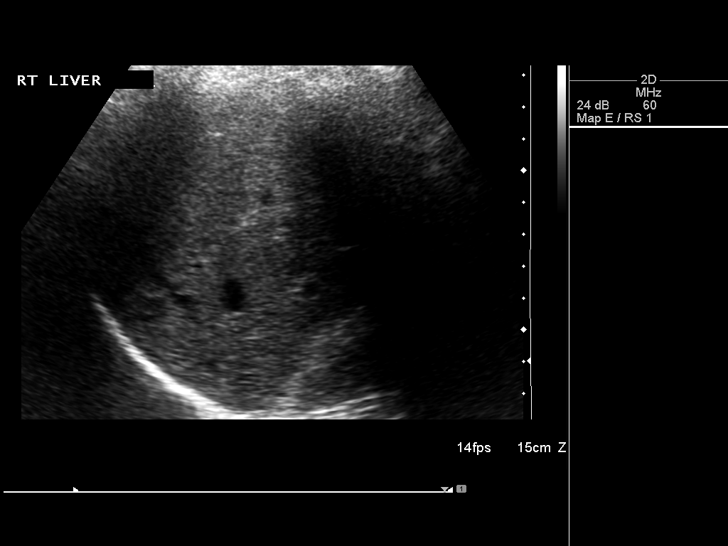
[im 28/83]
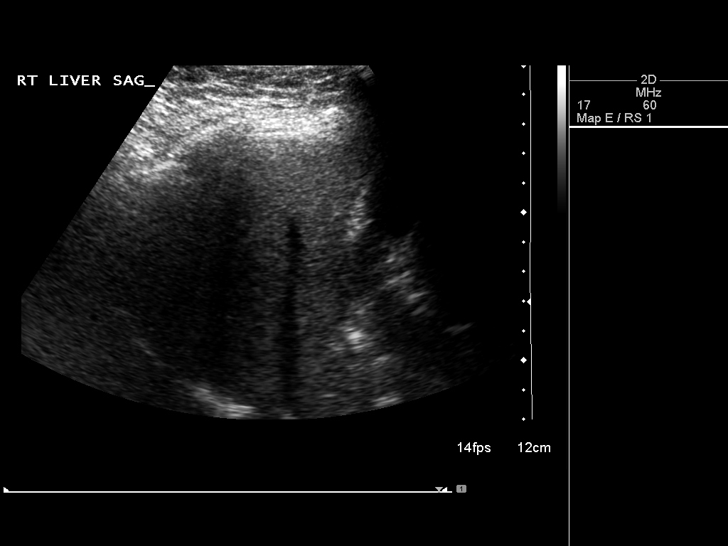
[im 31/83]
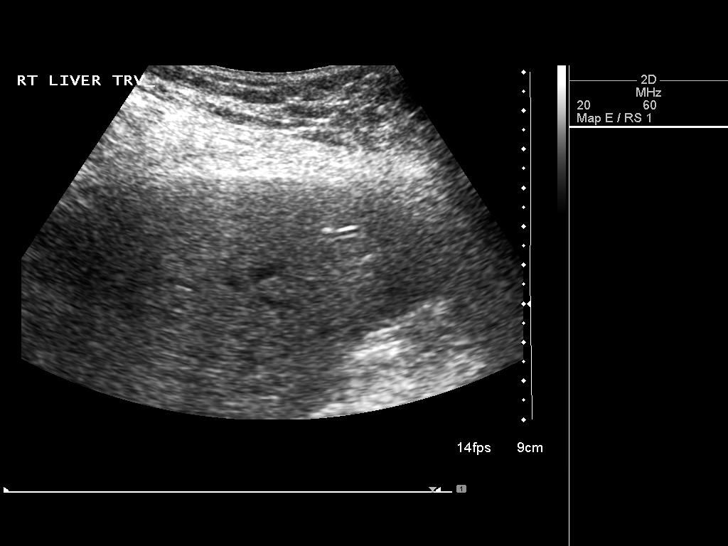
[im 38/83]
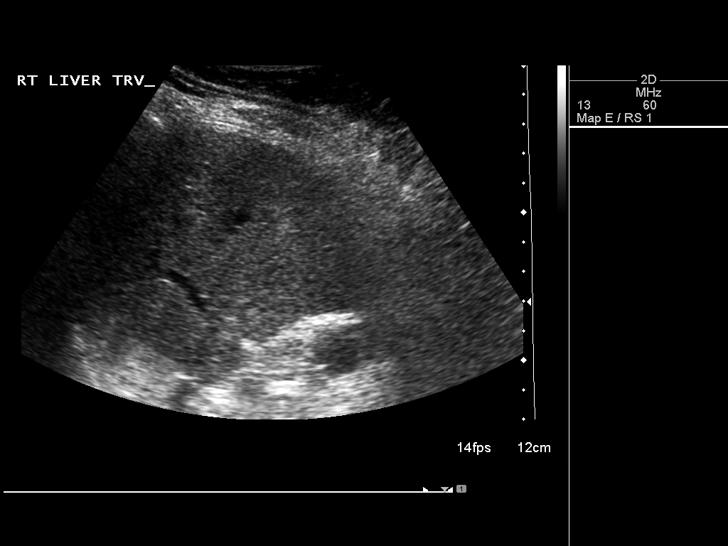
[im 45/83]
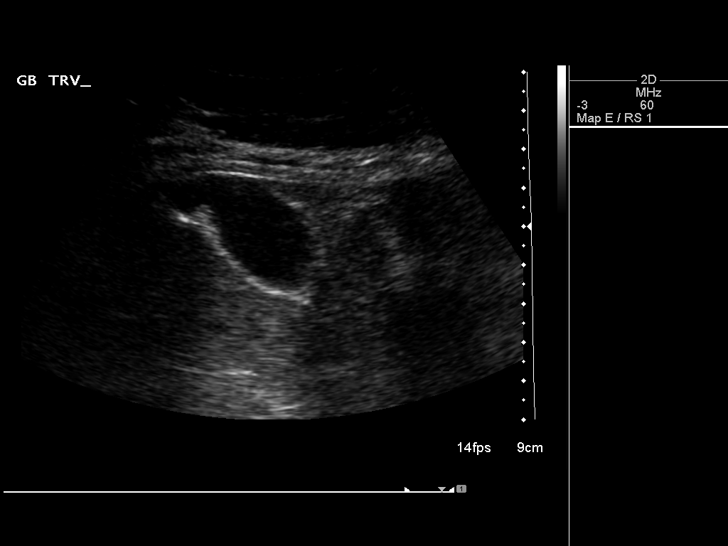
[im 52/83]
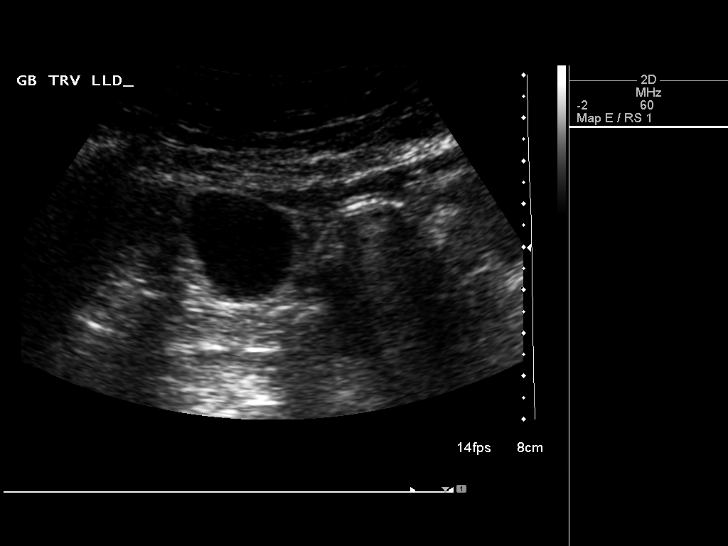
[im 55/83]
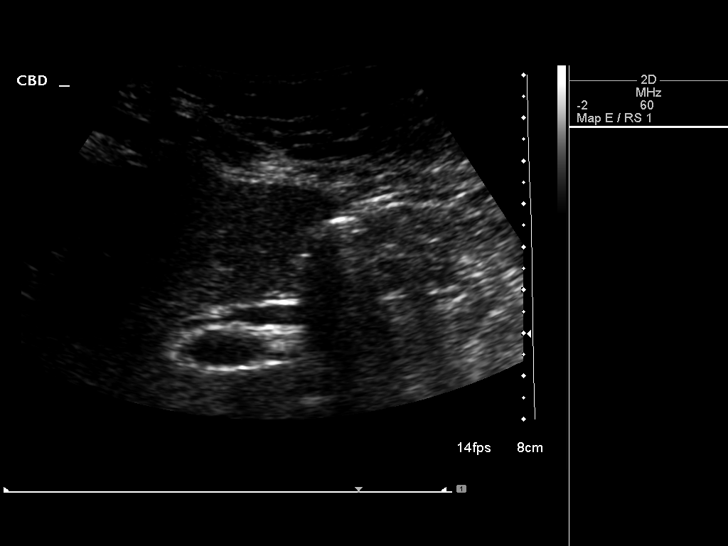
[im 62/83]
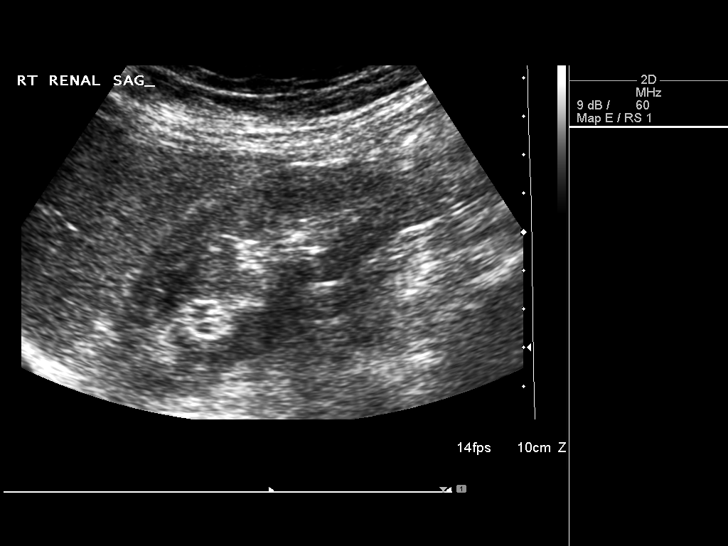
[im 69/83]
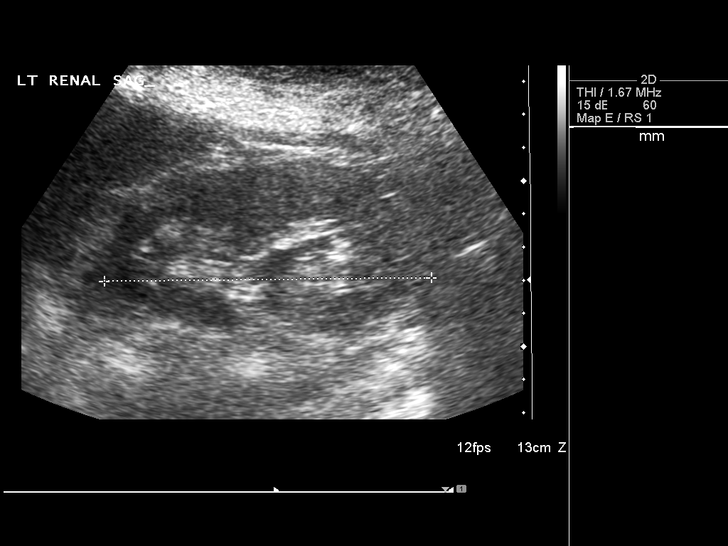
[im 76/83]
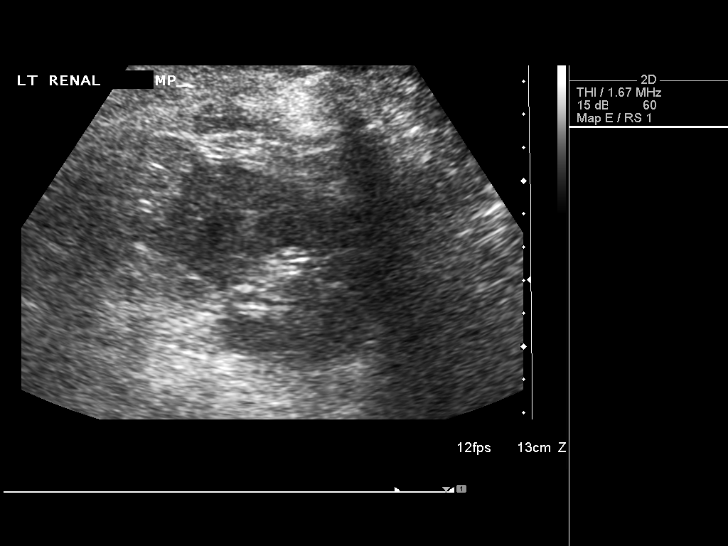
[im 83/83]
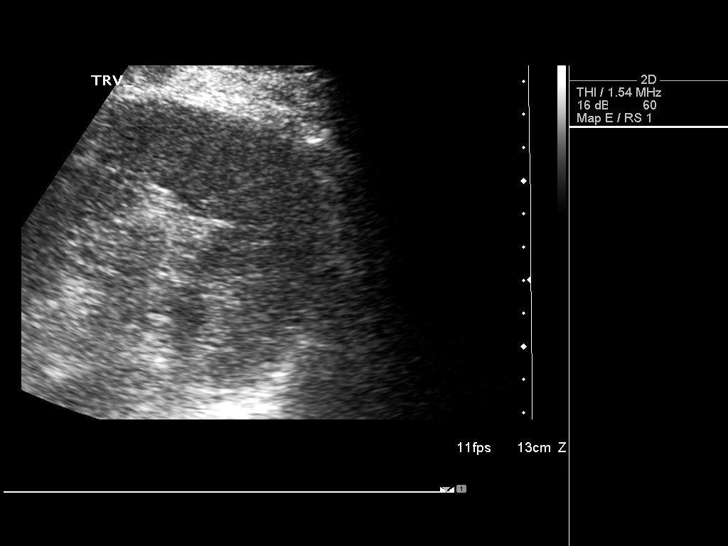

[14 of 25 positions shown; findings below may reference images not displayed]

FINDINGS: Gallbladder:  No gallstones, gallbladder wall thickening, or
pericholecystic fluid. Negative sonographic Murphy's sign.

Common bile duct:  Normal.  5.3 mm in diameter.

Liver:  Normal.

IVC:  Normal.

Pancreas:  Normal.

Spleen:  Normal.  6.6 cm in length.

Right Kidney:  Normal.  9.6 cm in length.

Left Kidney:  Normal.  9.9 cm in length.

Abdominal aorta:  Normal.  2.0 cm maximum diameter.
IMPRESSION: Negative abdominal ultrasound.

## 2016-03-15 ENCOUNTER — Ambulatory Visit (INDEPENDENT_AMBULATORY_CARE_PROVIDER_SITE_OTHER): Payer: Self-pay | Admitting: Physician Assistant

## 2016-03-15 VITALS — BP 118/70 | HR 67 | Temp 98.0°F | Resp 16 | Ht 59.0 in | Wt 137.2 lb

## 2016-03-15 DIAGNOSIS — S0083XA Contusion of other part of head, initial encounter: Secondary | ICD-10-CM

## 2016-03-15 DIAGNOSIS — S0990XA Unspecified injury of head, initial encounter: Secondary | ICD-10-CM

## 2016-03-15 NOTE — Progress Notes (Signed)
   Beth Brooks  MRN: [id number] DOB: 27-Dec-1955  Subjective:  Pt presents to clinic with back of head pain that started yesterday when she fell off a chair and hit her head on the ground.  She does not have a headache but she does have local pain in the area on the back of her head where she hit the ground.  She is having no dizziness or confusion and no N/V.  The person with her today says that she is acting normal to her.     She took Aleve for the pain and it helped.  Family member interprets for patient.  Review of Systems  Constitutional: Negative for chills and fever.  Neurological: Negative for dizziness and headaches.    Patient Active Problem List   Diagnosis Date Noted  . GERD (gastroesophageal reflux disease) 01/17/2012  . Epigastric abdominal pain 01/17/2012    No current outpatient prescriptions on file prior to visit.   No current facility-administered medications on file prior to visit.     No Known Allergies  Pt patients past, family and social history were reviewed and updated.   Objective:  BP 118/70   Pulse 67   Temp 98 F (36.7 C) (Oral)   Resp 16   Ht 4\' 11"  (1.499 m)   Wt 137 lb 3.2 oz (62.2 kg)   SpO2 98%   BMI 27.71 kg/m   Physical Exam  Constitutional: She is oriented to person, place, and time and well-developed, well-nourished, and in no distress.  HENT:  Head: Normocephalic and atraumatic.  Right Ear: Hearing and external ear normal.  Left Ear: Hearing and external ear normal.  Eyes: Conjunctivae are normal.  Neck: Normal range of motion.  Cardiovascular: Normal rate, regular rhythm and normal heart sounds.   No murmur heard. Pulmonary/Chest: Effort normal and breath sounds normal. She has no wheezes.  Neurological: She is alert and oriented to person, place, and time. She has normal sensation, normal strength, normal reflexes and intact cranial nerves. She displays normal reflexes. No cranial nerve deficit. She has a normal  Straight Leg Raise Test, a normal Cerebellar Exam and a normal Romberg Test. She shows no pronator drift. Gait normal. Coordination normal.  Skin: Skin is warm and dry.  Palpable soft area on the left occiput that is mildly TTP no broken skin seen.  Psychiatric: Mood, memory, affect and judgment normal.  Vitals reviewed.   Assessment and Plan :  Injury of head, initial encounter  Contusion of other part of head, initial encounter   D/w pt what to look for - ok to use ice on the back of her head to decrease the swelling for the next 48h and then she can use heat if she would like.  She can use tylenol for the pain.  We discussed things to watch for and if they occur when to seek further medical care.  Windell Hummingbird PA-C  [facility] 03/15/2016 7:14 PM

## 2016-03-15 NOTE — Patient Instructions (Addendum)
  Zantac 150mg  up to 2x/day for when you get heartburn from those certain foods  Ice to your head ok to use tylenol for the pain

## 2016-09-26 ENCOUNTER — Encounter: Payer: Self-pay | Admitting: Internal Medicine

## 2016-09-26 ENCOUNTER — Ambulatory Visit (INDEPENDENT_AMBULATORY_CARE_PROVIDER_SITE_OTHER): Payer: Self-pay | Admitting: Internal Medicine

## 2016-09-26 VITALS — BP 122/80 | HR 72 | Resp 12 | Ht <= 58 in | Wt 139.0 lb

## 2016-09-26 DIAGNOSIS — Z7689 Persons encountering health services in other specified circumstances: Secondary | ICD-10-CM

## 2016-09-26 NOTE — Progress Notes (Signed)
   Subjective:    Patient ID: Beth Brooks, female    DOB: February 06, 1956, 61 y.o.   MRN: [id number]  HPI   Here to establish  Would like to get set up for a complete physical.  Current Meds  Medication Sig  . Multiple Vitamin (MULTIVITAMIN) tablet Take 1 tablet by mouth daily.  . Omega-3 Fatty Acids (FISH OIL PO) Take by mouth daily.   No Known Allergies   Past Medical History:  Diagnosis Date  . Gastritis   . GERD (gastroesophageal reflux disease)     Past Surgical History:  Procedure Laterality Date  . TUBAL LIGATION  1994    Social History   Social History  . Marital status: Married    Spouse name: Beth Brooks  . Number of children: 8  . Years of education: 8   Occupational History  . Housewife    Social History Main Topics  . Smoking status: Never Smoker  . Smokeless tobacco: Never Used  . Alcohol use No  . Drug use: No  . Sexual activity: Not on file     Comment: married   Other Topics Concern  . Not on file   Social History Narrative   Originally from [place]   [place]   Came to [place]. In [place] with her husband and one daughter.   Husband with hepatic carcinoma, for which he has been treated past 3 years.     She has support to give her time off.   Son who interprets takes her husband for chemo twice weekly.   Family History  Problem Relation Age of Onset  . Gestational diabetes Daughter   . Alcohol abuse Brother   . Cerebrovascular Accident Brother        "blood clot on brain"  [place] now.      Review of Systems     Objective:   Physical Exam NAD Lungs:  CTA CV: RRR with normal S1 and S2, No S3, S4 or murmur, radial pulses normal and equal LE:  No edema.       Assessment & Plan:  Establishing care Return in 3 months for fasting labs:  CBC, CMP, FLP followed by CPE with pap 2 days later.

## 2017-01-08 ENCOUNTER — Other Ambulatory Visit (INDEPENDENT_AMBULATORY_CARE_PROVIDER_SITE_OTHER): Payer: Self-pay

## 2017-01-08 DIAGNOSIS — Z79899 Other long term (current) drug therapy: Secondary | ICD-10-CM

## 2017-01-08 DIAGNOSIS — Z13220 Encounter for screening for lipoid disorders: Secondary | ICD-10-CM

## 2017-01-09 LAB — COMPREHENSIVE METABOLIC PANEL
A/G RATIO: 1.8 (ref 1.2–2.2)
ALK PHOS: 48 IU/L (ref 39–117)
ALT: 9 IU/L (ref 0–32)
AST: 15 IU/L (ref 0–40)
Albumin: 4.7 g/dL (ref 3.5–5.5)
BILIRUBIN TOTAL: 0.4 mg/dL (ref 0.0–1.2)
BUN / CREAT RATIO: 32 — AB (ref 9–23)
BUN: 24 mg/dL (ref 6–24)
CHLORIDE: 106 mmol/L (ref 96–106)
CO2: 23 mmol/L (ref 20–29)
Calcium: 9.3 mg/dL (ref 8.7–10.2)
Creatinine, Ser: 0.75 mg/dL (ref 0.57–1.00)
GFR calc Af Amer: 101 mL/min/{1.73_m2} (ref >59)
GFR calc non Af Amer: 88 mL/min/{1.73_m2} (ref >59)
GLUCOSE: 93 mg/dL (ref 65–99)
Globulin, Total: 2.6 g/dL (ref 1.5–4.5)
POTASSIUM: 4.7 mmol/L (ref 3.5–5.2)
Sodium: 148 mmol/L — ABNORMAL HIGH (ref 134–144)
Total Protein: 7.3 g/dL (ref 6.0–8.5)

## 2017-01-09 LAB — CBC WITH DIFFERENTIAL/PLATELET
BASOS ABS: 0 10*3/uL (ref 0.0–0.2)
Basos: 0 %
EOS (ABSOLUTE): 0.3 10*3/uL (ref 0.0–0.4)
Eos: 5 %
Hematocrit: 45.4 % (ref 34.0–46.6)
Hemoglobin: 15.1 g/dL (ref 11.1–15.9)
Immature Grans (Abs): 0 10*3/uL (ref 0.0–0.1)
Immature Granulocytes: 0 %
LYMPHS ABS: 2.3 10*3/uL (ref 0.7–3.1)
Lymphs: 47 %
MCH: 29.7 pg (ref 26.6–33.0)
MCHC: 33.3 g/dL (ref 31.5–35.7)
MCV: 89 fL (ref 79–97)
MONOS ABS: 0.4 10*3/uL (ref 0.1–0.9)
Monocytes: 7 %
NEUTROS ABS: 2.1 10*3/uL (ref 1.4–7.0)
Neutrophils: 41 %
PLATELETS: 127 10*3/uL — AB (ref 150–379)
RBC: 5.09 x10E6/uL (ref 3.77–5.28)
RDW: 13.6 % (ref 12.3–15.4)
WBC: 5 10*3/uL (ref 3.4–10.8)

## 2017-01-09 LAB — LIPID PANEL W/O CHOL/HDL RATIO
CHOLESTEROL TOTAL: 185 mg/dL (ref 100–199)
HDL: 53 mg/dL (ref >39)
LDL Calculated: 118 mg/dL — ABNORMAL HIGH (ref 0–99)
Triglycerides: 72 mg/dL (ref 0–149)
VLDL Cholesterol Cal: 14 mg/dL (ref 5–40)

## 2017-01-11 ENCOUNTER — Encounter: Payer: Self-pay | Admitting: Internal Medicine

## 2017-01-11 ENCOUNTER — Ambulatory Visit (INDEPENDENT_AMBULATORY_CARE_PROVIDER_SITE_OTHER): Payer: Self-pay | Admitting: Internal Medicine

## 2017-01-11 VITALS — BP 130/88 | HR 68 | Resp 12 | Ht <= 58 in | Wt 140.0 lb

## 2017-01-11 DIAGNOSIS — E663 Overweight: Secondary | ICD-10-CM | POA: Insufficient documentation

## 2017-01-11 DIAGNOSIS — Z124 Encounter for screening for malignant neoplasm of cervix: Secondary | ICD-10-CM

## 2017-01-11 DIAGNOSIS — Z9189 Other specified personal risk factors, not elsewhere classified: Secondary | ICD-10-CM

## 2017-01-11 DIAGNOSIS — Z1239 Encounter for other screening for malignant neoplasm of breast: Secondary | ICD-10-CM

## 2017-01-11 DIAGNOSIS — Z1231 Encounter for screening mammogram for malignant neoplasm of breast: Secondary | ICD-10-CM

## 2017-01-11 DIAGNOSIS — J3089 Other allergic rhinitis: Secondary | ICD-10-CM

## 2017-01-11 DIAGNOSIS — Z0000 Encounter for general adult medical examination without abnormal findings: Secondary | ICD-10-CM

## 2017-01-11 HISTORY — DX: Overweight: E66.3

## 2017-01-11 MED ORDER — CETIRIZINE HCL 10 MG PO TABS: 10.0000 mg | ORAL_TABLET | Freq: Every day | ORAL | 11 refills | Status: DC

## 2017-01-11 MED ORDER — INFLUENZA VAC SPLIT QUAD 0.5 ML IM SUSY: 0.5000 mL | PREFILLED_SYRINGE | Freq: Once | INTRAMUSCULAR | 0 refills | Status: AC

## 2017-01-11 MED ORDER — CALCIUM CITRATE-VITAMIN D 500-400 MG-UNIT PO CHEW: 1.0000 | CHEWABLE_TABLET | Freq: Two times a day (BID) | ORAL | Status: DC

## 2017-01-11 NOTE — Patient Instructions (Signed)
Can google "advance directives, [place]"  And bring up form from Secretary of State. Print and fill out Or can go to "5 wishes"  Which is also in Spanish and fill out--this costs $5--perhaps easier to use. Designate a Medical Power of Attorney to speak for you if you are unable to speak for yourself when ill or injured  

## 2017-01-11 NOTE — Progress Notes (Signed)
Subjective:    Patient ID: Beth Brooks, female    DOB: 1956/02/09, 61 y.o.   MRN: [id number]  HPI   Daughter, Vicente Males, interprets:  [place].  Mother [place].  CPE with pap  1.  Pap:  Last was a long time ago.  Was normal.  No family history  2.  Mammogram:  Never had a mammogram.  No family history of breast cancer.  3.  Osteoprevention:  No dairy intake.  No cheese, milk, or yogurt.  Walks for 30 minutes 2-3 times weekly.  Watches 73 month old granddaughter and husband with cancer, so cannot leave him for long period of time.   4.  Guaiac Cards:  Many years ago.  Negative.      5.  Colonoscopy: 01/31/2012 screen:  No polyps.  Internal hemorrhoids.  Underwent EGD as well with findings of mild gastritis and negative dysplasia or H. Pylori.  No family history of colon cancer.   6.  Immunizations:  No tetanus vaccine in past 10 years.  No influenza--they would like the free influenza we have left at [facility].  7.  Glucose/Cholesterol:  Fasting labs 01/08/2017 with normal fasting glucose. Lipids fine as well. Lipid Panel     Component Value Date/Time   CHOL 185 01/08/2017 0859   TRIG 72 01/08/2017 0859   HDL 53 01/08/2017 0859   LDLCALC 118 (H) 01/08/2017 0859   Current Meds  Medication Sig  . Omega-3 Fatty Acids (FISH OIL PO) Take by mouth daily.    No Known Allergies   Past Medical History:  Diagnosis Date  . Gastritis   . GERD (gastroesophageal reflux disease)   . Overweight 01/11/2017    Past Surgical History:  Procedure Laterality Date  . TUBAL LIGATION  1994   Family History  Problem Relation Age of Onset  . Gestational diabetes Daughter   . Alcohol abuse Brother   . Cerebrovascular Accident Brother        "blood clot on brain"  [place] now.   Social History   Social History  . Marital status: Married    Spouse name: Eddie Dibbles Adup  . Number of children: 8  . Years of education: 8   Occupational History  . Housewife    Social History Main Topics  .  Smoking status: Never Smoker  . Smokeless tobacco: Never Used  . Alcohol use No  . Drug use: No  . Sexual activity: Not on file     Comment: married   Other Topics Concern  . Not on file   Social History Narrative   Originally from [place]   [place]   Came to [facility]. In [place] with her husband and one daughter.   Husband with hepatic carcinoma, for which he has been treated past 3 years.     She has support to give her time off.   Son who interprets takes her husband for chemo twice weekly.     Review of Systems  Constitutional: Negative for appetite change and unexpected weight change.  HENT: Negative for dental problem, ear pain and hearing loss.        Intermittently with itching of throat and nose, for which she takes Cetirizine  Eyes: Positive for visual disturbance (wears bifocals--last check May 2018.  [facility] [employer].  Dr. is [place]).  Respiratory: Negative for cough and shortness of breath.   Cardiovascular: Negative for chest pain, palpitations and leg swelling.  Gastrointestinal: Positive for blood in stool (if stool  hard-can have a bit of blood on tissue.). Negative for abdominal pain.  Genitourinary: Negative for dysuria, hematuria, pelvic pain and vaginal discharge.  Musculoskeletal: Negative for arthralgias.       States sometimes knees stiff after sitting for a bit  Skin: Negative for rash.  Neurological: Negative for weakness and numbness.  Hematological: Negative for adenopathy. Does not bruise/bleed easily.  Psychiatric/Behavioral: Negative for dysphoric mood. The patient is not nervous/anxious.        Objective:   Physical Exam  Constitutional: She is oriented to person, place, and time. She appears well-developed and well-nourished.  HENT:  Head: Normocephalic and atraumatic.  Right Ear: Hearing, tympanic membrane, external ear and ear canal normal.  Left Ear: Hearing, tympanic membrane, external ear and ear canal normal.  Nose:  Mucosal edema and rhinorrhea (clear) present.  Mouth/Throat: Uvula is midline and oropharynx is clear and moist.  Eyes: Pupils are equal, round, and reactive to light. Conjunctivae and EOM are normal.  Discs sharp bilaterally  Neck: Normal range of motion and full passive range of motion without pain. Neck supple. No thyromegaly present.  Cardiovascular: Normal rate, regular rhythm, S1 normal and S2 normal.  Exam reveals no S3, no S4 and no friction rub.   No murmur heard. No carotid bruits.  Carotid, radial, femoral, DP and PT pulses normal and equal.   Pulmonary/Chest: Effort normal and breath sounds normal. Right breast exhibits no inverted nipple, no mass, no nipple discharge, no skin change and no tenderness. Left breast exhibits no inverted nipple, no mass, no nipple discharge, no skin change and no tenderness.  Abdominal: Soft. Bowel sounds are normal. She exhibits no mass. There is no hepatosplenomegaly. There is no tenderness. No hernia.  Genitourinary: Rectum normal. Rectal exam shows no mass and guaiac negative stool.  Genitourinary Comments: Normal external genitalia, No vaginal discharge.  No cervical lesion, No CMT.  No uterine or adnexal mass or tenderness.  Musculoskeletal: Normal range of motion.  Lymphadenopathy:       Head (right side): No submental and no submandibular adenopathy present.       Head (left side): No submental and no submandibular adenopathy present.    She has no cervical adenopathy.    She has no axillary adenopathy.       Right: No inguinal and no supraclavicular adenopathy present.       Left: No inguinal and no supraclavicular adenopathy present.  Neurological: She is alert and oriented to person, place, and time. She has normal strength and normal reflexes. She displays normal reflexes. No cranial nerve deficit or sensory deficit. Coordination and gait normal.  Skin: Skin is warm and dry. No rash noted.  Psychiatric: She has a normal mood and affect. Her  speech is normal and behavior is normal. Judgment and thought content normal. Cognition and memory are normal.          Assessment & Plan:  1.  CPE with pap Schedule mammogram/scholarship Flu vaccine through [facility]. Guaiac cards to return in 2 weeks. Fasting labs performed 01/08/2017 and discussed.  2.  Need for dental care:  Dental clinic referral.  3.  Overweight: discussed lifestyle changes with diet and physical activity.    4.  Allergies:  Cetirizine 10 mg daily.

## 2017-01-12 LAB — CYTOLOGY - PAP

## 2017-01-26 ENCOUNTER — Other Ambulatory Visit (INDEPENDENT_AMBULATORY_CARE_PROVIDER_SITE_OTHER): Payer: Self-pay

## 2017-01-26 DIAGNOSIS — Z1211 Encounter for screening for malignant neoplasm of colon: Secondary | ICD-10-CM

## 2017-01-26 LAB — POC HEMOCCULT BLD/STL (HOME/3-CARD/SCREEN)
Card #3 Fecal Occult Blood, POC: NEGATIVE
FECAL OCCULT BLD: NEGATIVE
Fecal Occult Blood, POC: NEGATIVE

## 2018-12-23 ENCOUNTER — Other Ambulatory Visit: Payer: Self-pay

## 2018-12-23 ENCOUNTER — Encounter: Payer: Self-pay | Admitting: Registered Nurse

## 2018-12-23 ENCOUNTER — Ambulatory Visit (INDEPENDENT_AMBULATORY_CARE_PROVIDER_SITE_OTHER): Payer: Self-pay | Admitting: Registered Nurse

## 2018-12-23 VITALS — BP 118/76 | HR 66 | Temp 97.6°F | Resp 16 | Ht 60.0 in | Wt 138.0 lb

## 2018-12-23 DIAGNOSIS — Z130 Encounter for screening for diseases of the blood and blood-forming organs and certain disorders involving the immune mechanism: Secondary | ICD-10-CM

## 2018-12-23 DIAGNOSIS — Z1329 Encounter for screening for other suspected endocrine disorder: Secondary | ICD-10-CM

## 2018-12-23 DIAGNOSIS — Z13220 Encounter for screening for lipoid disorders: Secondary | ICD-10-CM

## 2018-12-23 DIAGNOSIS — Z13228 Encounter for screening for other metabolic disorders: Secondary | ICD-10-CM

## 2018-12-23 DIAGNOSIS — L709 Acne, unspecified: Secondary | ICD-10-CM

## 2018-12-23 MED ORDER — BENZOYL PEROXIDE-ERYTHROMYCIN 5-3 % EX GEL: Freq: Two times a day (BID) | CUTANEOUS | 0 refills | Status: DC

## 2018-12-23 NOTE — Patient Instructions (Addendum)
   If you have lab work done today you will be contacted with your lab results within the next 2 weeks.  If you have not heard from us then please contact us. The fastest way to get your results is to register for My Chart.   IF you received an x-ray today, you will receive an invoice from [facility]. Please contact [facility] at [phone] with questions or concerns regarding your invoice.   IF you received labwork today, you will receive an invoice from [facility]. Please contact [facility] at [phone] with questions or concerns regarding your invoice.   Our billing staff will not be able to assist you with questions regarding bills from these companies.  You will be contacted with the lab results as soon as they are available. The fastest way to get your results is to activate your My Chart account. Instructions are located on the last page of this paperwork. If you have not heard from us regarding the results in 2 weeks, please contact this office.       Health Maintenance, Female Adopting a healthy lifestyle and getting preventive care are important in promoting health and wellness. Ask your health care provider about:  The right schedule for you to have regular tests and exams.  Things you can do on your own to prevent diseases and keep yourself healthy. What should I know about diet, weight, and exercise? Eat a healthy diet   Eat a diet that includes plenty of vegetables, fruits, low-fat dairy products, and lean protein.  Do not eat a lot of foods that are high in solid fats, added sugars, or sodium. Maintain a healthy weight Body mass index (BMI) is used to identify weight problems. It estimates body fat based on height and weight. Your health care provider can help determine your BMI and help you achieve or maintain a healthy weight. Get regular exercise Get regular exercise. This is one of the most important things you can do for your health. Most  adults should:  Exercise for at least 150 minutes each week. The exercise should increase your heart rate and make you sweat (moderate-intensity exercise).  Do strengthening exercises at least twice a week. This is in addition to the moderate-intensity exercise.  Spend less time sitting. Even light physical activity can be beneficial. Watch cholesterol and blood lipids Have your blood tested for lipids and cholesterol at 62 years of age, then have this test every 5 years. Have your cholesterol levels checked more often if:  Your lipid or cholesterol levels are high.  You are older than 62 years of age.  You are at high risk for heart disease. What should I know about cancer screening? Depending on your health history and family history, you may need to have cancer screening at various ages. This may include screening for:  Breast cancer.  Cervical cancer.  Colorectal cancer.  Skin cancer.  Lung cancer. What should I know about heart disease, diabetes, and high blood pressure? Blood pressure and heart disease  High blood pressure causes heart disease and increases the risk of stroke. This is more likely to develop in people who have high blood pressure readings, are of African descent, or are overweight.  Have your blood pressure checked: ? Every 3-5 years if you are 18-39 years of age. ? Every year if you are 40 years old or older. Diabetes Have regular diabetes screenings. This checks your fasting blood sugar level. Have the screening done:  Once   every three years after age 40 if you are at a normal weight and have a low risk for diabetes.  More often and at a younger age if you are overweight or have a high risk for diabetes. What should I know about preventing infection? Hepatitis B If you have a higher risk for hepatitis B, you should be screened for this virus. Talk with your health care provider to find out if you are at risk for hepatitis B infection. Hepatitis  C Testing is recommended for:  Everyone born from 1945 through 1965.  Anyone with known risk factors for hepatitis C. Sexually transmitted infections (STIs)  Get screened for STIs, including gonorrhea and chlamydia, if: ? You are sexually active and are younger than 62 years of age. ? You are older than 62 years of age and your health care provider tells you that you are at risk for this type of infection. ? Your sexual activity has changed since you were last screened, and you are at increased risk for chlamydia or gonorrhea. Ask your health care provider if you are at risk.  Ask your health care provider about whether you are at high risk for HIV. Your health care provider may recommend a prescription medicine to help prevent HIV infection. If you choose to take medicine to prevent HIV, you should first get tested for HIV. You should then be tested every 3 months for as long as you are taking the medicine. Pregnancy  If you are about to stop having your period (premenopausal) and you may become pregnant, seek counseling before you get pregnant.  Take 400 to 800 micrograms (mcg) of folic acid every day if you become pregnant.  Ask for birth control (contraception) if you want to prevent pregnancy. Osteoporosis and menopause Osteoporosis is a disease in which the bones lose minerals and strength with aging. This can result in bone fractures. If you are 65 years old or older, or if you are at risk for osteoporosis and fractures, ask your health care provider if you should:  Be screened for bone loss.  Take a calcium or vitamin D supplement to lower your risk of fractures.  Be given hormone replacement therapy (HRT) to treat symptoms of menopause. Follow these instructions at home: Lifestyle  Do not use any products that contain nicotine or tobacco, such as cigarettes, e-cigarettes, and chewing tobacco. If you need help quitting, ask your health care provider.  Do not use street  drugs.  Do not share needles.  Ask your health care provider for help if you need support or information about quitting drugs. Alcohol use  Do not drink alcohol if: ? Your health care provider tells you not to drink. ? You are pregnant, may be pregnant, or are planning to become pregnant.  If you drink alcohol: ? Limit how much you use to 0-1 drink a day. ? Limit intake if you are breastfeeding.  Be aware of how much alcohol is in your drink. In the [place] one drink equals one 12 oz bottle of beer (355 mL), one 5 oz glass of wine (148 mL), or one 1 oz glass of hard liquor (44 mL). General instructions  Schedule regular health, dental, and eye exams.  Stay current with your vaccines.  Tell your health care provider if: ? You often feel depressed. ? You have ever been abused or do not feel safe at home. Summary  Adopting a healthy lifestyle and getting preventive care are important in promoting health and   wellness.  Follow your health care provider's instructions about healthy diet, exercising, and getting tested or screened for diseases.  Follow your health care provider's instructions on monitoring your cholesterol and blood pressure. This information is not intended to replace advice given to you by your health care provider. Make sure you discuss any questions you have with your health care provider. Document Released: 09/26/2010 Document Revised: 03/06/2018 Document Reviewed: 03/06/2018 Elsevier Patient Education  2020 [employer]     Why follow it? Research shows. . Those who follow the Mediterranean diet have a reduced risk of heart disease  . The diet is associated with a reduced incidence of Parkinson's and Alzheimer's diseases . People following the diet may have longer life expectancies and lower rates of chronic diseases  . The Dietary Guidelines for Americans recommends the Mediterranean diet as an eating plan to promote health and prevent disease  What Is the  Mediterranean Diet?  . Healthy eating plan based on typical foods and recipes of Mediterranean-style cooking . The diet is primarily a plant based diet; these foods should make up a majority of meals   Starches - Plant based foods should make up a majority of meals - They are an important sources of vitamins, minerals, energy, antioxidants, and fiber - Choose whole grains, foods high in fiber and minimally processed items  - Typical grain sources include wheat, oats, barley, corn, brown rice, bulgar, farro, millet, polenta, couscous  - Various types of beans include chickpeas, lentils, fava beans, black beans, white beans   Fruits  Veggies - Large quantities of antioxidant rich fruits & veggies; 6 or more servings  - Vegetables can be eaten raw or lightly drizzled with oil and cooked  - Vegetables common to the traditional Mediterranean Diet include: artichokes, arugula, beets, broccoli, brussel sprouts, cabbage, carrots, celery, collard greens, cucumbers, eggplant, kale, leeks, lemons, lettuce, mushrooms, okra, onions, peas, peppers, potatoes, pumpkin, radishes, rutabaga, shallots, spinach, sweet potatoes, turnips, zucchini - Fruits common to the Mediterranean Diet include: apples, apricots, avocados, cherries, clementines, dates, figs, grapefruits, grapes, melons, nectarines, oranges, peaches, pears, pomegranates, strawberries, tangerines  Fats - Replace butter and margarine with healthy oils, such as olive oil, canola oil, and tahini  - Limit nuts to no more than a handful a day  - Nuts include walnuts, almonds, pecans, pistachios, pine nuts  - Limit or avoid candied, honey roasted or heavily salted nuts - Olives are central to the Mediterranean diet - can be eaten whole or used in a variety of dishes   Meats Protein - Limiting red meat: no more than a few times a month - When eating red meat: choose lean cuts and keep the portion to the size of deck of cards - Eggs: approx. 0 to 4 times a  week  - Fish and lean poultry: at least 2 a week  - Healthy protein sources include, chicken, turkey, lean beef, lamb - Increase intake of seafood such as tuna, salmon, trout, mackerel, shrimp, scallops - Avoid or limit high fat processed meats such as sausage and bacon  Dairy - Include moderate amounts of low fat dairy products  - Focus on healthy dairy such as fat free yogurt, skim milk, low or reduced fat cheese - Limit dairy products higher in fat such as whole or 2% milk, cheese, ice cream  Alcohol - Moderate amounts of red wine is ok  - No more than 5 oz daily for women (all ages) and men older than age 65  -   No more than 10 oz of wine daily for men younger than 65  Other - Limit sweets and other desserts  - Use herbs and spices instead of salt to flavor foods  - Herbs and spices common to the traditional Mediterranean Diet include: basil, bay leaves, chives, cloves, cumin, fennel, garlic, lavender, marjoram, mint, oregano, parsley, pepper, rosemary, sage, savory, sumac, tarragon, thyme   It's not just a diet, it's a lifestyle:  . The Mediterranean diet includes lifestyle factors typical of those in the region  . Foods, drinks and meals are best eaten with others and savored . Daily physical activity is important for overall good health . This could be strenuous exercise like running and aerobics . This could also be more leisurely activities such as walking, housework, yard-work, or taking the stairs . Moderation is the key; a balanced and healthy diet accommodates most foods and drinks . Consider portion sizes and frequency of consumption of certain foods   Meal Ideas & Options:  . Breakfast:  o Whole wheat toast or whole wheat English muffins with peanut butter & hard boiled egg o Steel cut oats topped with apples & cinnamon and skim milk  o Fresh fruit: banana, strawberries, melon, berries, peaches  o Smoothies: strawberries, bananas, greek yogurt, peanut butter o Low fat  greek yogurt with blueberries and granola  o Egg white omelet with spinach and mushrooms o Breakfast couscous: whole wheat couscous, apricots, skim milk, cranberries  . Sandwiches:  o Hummus and grilled vegetables (peppers, zucchini, squash) on whole wheat bread   o Grilled chicken on whole wheat pita with lettuce, tomatoes, cucumbers or tzatziki  o Tuna salad on whole wheat bread: tuna salad made with greek yogurt, olives, red peppers, capers, green onions o Garlic rosemary lamb pita: lamb sauted with garlic, rosemary, salt & pepper; add lettuce, cucumber, greek yogurt to pita - flavor with lemon juice and black pepper  . Seafood:  o Mediterranean grilled salmon, seasoned with garlic, basil, parsley, lemon juice and black pepper o Shrimp, lemon, and spinach whole-grain pasta salad made with low fat greek yogurt  o Seared scallops with lemon orzo  o Seared tuna steaks seasoned salt, pepper, coriander topped with tomato mixture of olives, tomatoes, olive oil, minced garlic, parsley, green onions and cappers  . Meats:  o Herbed greek chicken salad with kalamata olives, cucumber, feta  o Red bell peppers stuffed with spinach, bulgur, lean ground beef (or lentils) & topped with feta   o Kebabs: skewers of chicken, tomatoes, onions, zucchini, squash  o Turkey burgers: made with red onions, mint, dill, lemon juice, feta cheese topped with roasted red peppers . Vegetarian o Cucumber salad: cucumbers, artichoke hearts, celery, red onion, feta cheese, tossed in olive oil & lemon juice  o Hummus and whole grain pita points with a greek salad (lettuce, tomato, feta, olives, cucumbers, red onion) o Lentil soup with celery, carrots made with vegetable broth, garlic, salt and pepper  o Tabouli salad: parsley, bulgur, mint, scallions, cucumbers, tomato, radishes, lemon juice, olive oil, salt and pepper.       Fat and Cholesterol Restricted Eating Plan Eating a diet that limits fat and cholesterol may  help lower your risk for heart disease and other conditions. Your body needs fat and cholesterol for basic functions, but eating too much of these things can be harmful to your health. Your health care provider may order lab tests to check your blood fat (lipid) and cholesterol levels. This helps your   health care provider understand your risk for certain conditions and whether you need to make diet changes. Work with your health care provider or dietitian to make an eating plan that is right for you. Your plan includes:  Limit your fat intake to ______% or less of your total calories a day.  Limit your saturated fat intake to ______% or less of your total calories a day.  Limit the amount of cholesterol in your diet to less than _________mg a day.  Eat ___________ g of fiber a day. What are tips for following this plan? General guidelines   If you are overweight, work with your health care provider to lose weight safely. Losing just 5-10% of your body weight can improve your overall health and help prevent diseases such as diabetes and heart disease.  Avoid: ? Foods with added sugar. ? Fried foods. ? Foods that contain partially hydrogenated oils, including stick margarine, some tub margarines, cookies, crackers, and other baked goods.  Limit alcohol intake to no more than 1 drink a day for nonpregnant women and 2 drinks a day for men. One drink equals 12 oz of beer, 5 oz of wine, or 1 oz of hard liquor. Reading food labels  Check food labels for: ? Trans fats, partially hydrogenated oils, or high amounts of saturated fat. Avoid foods that contain saturated fat and trans fat. ? The amount of cholesterol in each serving. Try to eat no more than 200 mg of cholesterol each day. ? The amount of fiber in each serving. Try to eat at least 20-30 g of fiber each day.  Choose foods with healthy fats, such as: ? Monounsaturated and polyunsaturated fats. These include olive and canola oil,  flaxseeds, walnuts, almonds, and seeds. ? Omega-3 fats. These are found in foods such as salmon, mackerel, sardines, tuna, flaxseed oil, and ground flaxseeds.  Choose grain products that have whole grains. Look for the word "whole" as the first word in the ingredient list. Cooking  Cook foods using methods other than frying. Baking, boiling, grilling, and broiling are some healthy options.  Eat more home-cooked food and less restaurant, buffet, and fast food.  Avoid cooking using saturated fats. ? Animal sources of saturated fats include meats, butter, and cream. ? Plant sources of saturated fats include palm oil, palm kernel oil, and coconut oil. Meal planning   At meals, imagine dividing your plate into fourths: ? Fill one-half of your plate with vegetables and green salads. ? Fill one-fourth of your plate with whole grains. ? Fill one-fourth of your plate with lean protein foods.  Eat fish that is high in omega-3 fats at least two times a week.  Eat more foods that contain fiber, such as whole grains, beans, apples, broccoli, carrots, peas, and barley. These foods help promote healthy cholesterol levels in the blood. Recommended foods Grains  Whole grains, such as whole wheat or whole grain breads, crackers, cereals, and pasta. Unsweetened oatmeal, bulgur, barley, quinoa, or brown rice. Corn or whole wheat flour tortillas. Vegetables  Fresh or frozen vegetables (raw, steamed, roasted, or grilled). Green salads. Fruits  All fresh, canned (in natural juice), or frozen fruits. Meats and other protein foods  Ground beef (85% or leaner), grass-fed beef, or beef trimmed of fat. Skinless chicken or turkey. Ground chicken or turkey. Pork trimmed of fat. All fish and seafood. Egg whites. Dried beans, peas, or lentils. Unsalted nuts or seeds. Unsalted canned beans. Natural nut butters without added sugar and oil. Dairy    Low-fat or nonfat dairy products, such as skim or 1% milk, 2% or  reduced-fat cheeses, low-fat and fat-free ricotta or cottage cheese, or plain low-fat and nonfat yogurt. Fats and oils  Tub margarine without trans fats. Light or reduced-fat mayonnaise and salad dressings. Avocado. Olive, canola, sesame, or safflower oils. The items listed above may not be a complete list of recommended foods or beverages. Contact your dietitian for more options. Foods to avoid Grains  White bread. White pasta. White rice. Cornbread. Bagels, pastries, and croissants. Crackers and snack foods that contain trans fat and hydrogenated oils. Vegetables  Vegetables cooked in cheese, cream, or butter sauce. Fried vegetables. Fruits  Canned fruit in heavy syrup. Fruit in cream or butter sauce. Fried fruit. Meats and other protein foods  Fatty cuts of meat. Ribs, chicken wings, bacon, sausage, bologna, salami, chitterlings, fatback, hot dogs, bratwurst, and packaged lunch meats. Liver and organ meats. Whole eggs and egg yolks. Chicken and turkey with skin. Fried meat. Dairy  Whole or 2% milk, cream, half-and-half, and cream cheese. Whole milk cheeses. Whole-fat or sweetened yogurt. Full-fat cheeses. Nondairy creamers and whipped toppings. Processed cheese, cheese spreads, and cheese curds. Beverages  Alcohol. Sugar-sweetened drinks such as sodas, lemonade, and fruit drinks. Fats and oils  Butter, stick margarine, lard, shortening, ghee, or bacon fat. Coconut, palm kernel, and palm oils. Sweets and desserts  Corn syrup, sugars, honey, and molasses. Candy. Jam and jelly. Syrup. Sweetened cereals. Cookies, pies, cakes, donuts, muffins, and ice cream. The items listed above may not be a complete list of foods and beverages to avoid. Contact your dietitian for more information. Summary  Your body needs fat and cholesterol for basic functions. However, eating too much of these things can be harmful to your health.  Work with your health care provider and dietitian to follow a  diet low in fat and cholesterol. Doing this may help lower your risk for heart disease and other conditions.  Choose healthy fats, such as monounsaturated and polyunsaturated fats, and foods high in omega-3 fatty acids.  Eat fiber-rich foods, such as whole grains, beans, peas, fruits, and vegetables.  Limit or avoid alcohol, fried foods, and foods high in saturated fats, partially hydrogenated oils, and sugar. This information is not intended to replace advice given to you by your health care provider. Make sure you discuss any questions you have with your health care provider. Document Released: 03/13/2005 Document Revised: 02/23/2017 Document Reviewed: 11/28/2016 Elsevier Patient Education  2020 [employer] Inc.  American Heart Association (AHA) Exercise Recommendation  Being physically active is important to prevent heart disease and stroke, the nation's No. 1and No. 5killers. To improve overall cardiovascular health, we suggest at least 150 minutes per week of moderate exercise or 75 minutes per week of vigorous exercise (or a combination of moderate and vigorous activity). Thirty minutes a day, five times a week is an easy goal to remember. You will also experience benefits even if you divide your time into two or three segments of 10 to 15 minutes per day.  For people who would benefit from lowering their blood pressure or cholesterol, we recommend 40 minutes of aerobic exercise of moderate to vigorous intensity three to four times a week to lower the risk for heart attack and stroke.  Physical activity is anything that makes you move your body and burn calories.  This includes things like climbing stairs or playing sports. Aerobic exercises benefit your heart, and include walking, jogging, swimming or biking. Strength and   stretching exercises are best for overall stamina and flexibility.  The simplest, positive change you can make to effectively improve your heart health is to start  walking. It's enjoyable, free, easy, social and great exercise. A walking program is flexible and boasts high success rates because people can stick with it. It's easy for walking to become a regular and satisfying part of life.   For Overall Cardiovascular Health:  At least 30 minutes of moderate-intensity aerobic activity at least 5 days per week for a total of 150  OR   At least 25 minutes of vigorous aerobic activity at least 3 days per week for a total of 75 minutes; or a combination of moderate- and vigorous-intensity aerobic activity  AND   Moderate- to high-intensity muscle-strengthening activity at least 2 days per week for additional health benefits.  For Lowering Blood Pressure and Cholesterol  An average 40 minutes of moderate- to vigorous-intensity aerobic activity 3 or 4 times per week  What if I can't make it to the time goal? Something is always better than nothing! And everyone has to start somewhere. Even if you've been sedentary for years, today is the day you can begin to make healthy changes in your life. If you don't think you'll make it for 30 or 40 minutes, set a reachable goal for today. You can work up toward your overall goal by increasing your time as you get stronger. Don't let all-or-nothing thinking rob you of doing what you can every day.  Source:http://www.heart.org    

## 2018-12-23 NOTE — Progress Notes (Signed)
Established Patient Office Visit  Subjective:  Patient ID: Beth Brooks, female    DOB: March 16, 1956  Age: 63 y.o. MRN: [id number]  CC:  Chief Complaint  Patient presents with  . Annual Exam  . Acne    HPI Beth Brooks presents for acne on face. Has appeared across entire face for around 2 months. States that there are productive lesions. No itching, pain, burning, feeling of skin thickening. Denies concurrent symptoms, including large and small joint pain, GI symptoms, headache, visual changes, known exposure to allergen. States that she has not changed her skincare routine, detergents, soaps, or anything else recently. Washes bedsheets once weekly. Rash does not appear elsewhere on body. Went through menopause at age 36 - this was 8 years ago. No hormonal acne at that time.   Past Medical History:  Diagnosis Date  . Gastritis   . GERD (gastroesophageal reflux disease)   . Overweight 01/11/2017    Past Surgical History:  Procedure Laterality Date  . TUBAL LIGATION  1994    Family History  Problem Relation Age of Onset  . Gestational diabetes Daughter   . Alcohol abuse Brother   . Cerebrovascular Accident Brother        "blood clot on brain"  [place] now.    Social History   Socioeconomic History  . Marital status: Married    Spouse name: Eddie Dibbles Adup  . Number of children: 8  . Years of education: 8  . Highest education level: Not on file  Occupational History  . Occupation: Housewife  Social Needs  . Financial resource strain: Not on file  . Food insecurity    Worry: Not on file    Inability: Not on file  . Transportation needs    Medical: Not on file    Non-medical: Not on file  Tobacco Use  . Smoking status: Never Smoker  . Smokeless tobacco: Never Used  Substance and Sexual Activity  . Alcohol use: No  . Drug use: No  . Sexual activity: Not on file    Comment: married  Lifestyle  . Physical activity    Days per week: Not on file    Minutes per  session: Not on file  . Stress: Not on file  Relationships  . Social Herbalist on phone: Not on file    Gets together: Not on file    Attends religious service: Not on file    Active member of club or organization: Not on file    Attends meetings of clubs or organizations: Not on file    Relationship status: Not on file  . Intimate partner violence    Fear of current or ex partner: Not on file    Emotionally abused: Not on file    Physically abused: Not on file    Forced sexual activity: Not on file  Other Topics Concern  . Not on file  Social History Narrative   Originally from [place]   [place]   Came to [facility]. In [place] with her husband and one daughter.   Husband with hepatic carcinoma, for which he has been treated past 3 years.     She has support to give her time off.   Son who interprets takes her husband for chemo twice weekly.    Outpatient Medications Prior to Visit  Medication Sig Dispense Refill  . calcium citrate-vitamin D 500-400 MG-UNIT chewable tablet Chew 1 tablet by mouth 2 (two) times daily.    Marland Kitchen  cetirizine (ZYRTEC) 10 MG tablet Take 1 tablet (10 mg total) by mouth daily. 30 tablet 11  . Multiple Vitamin (MULTIVITAMIN) tablet Take 1 tablet by mouth daily.    . Omega-3 Fatty Acids (FISH OIL PO) Take by mouth daily.     No facility-administered medications prior to visit.     No Known Allergies  ROS Review of Systems  Constitutional: Negative.   HENT: Negative.   Eyes: Negative.   Respiratory: Negative.   Cardiovascular: Negative.   Gastrointestinal: Negative.   Endocrine: Negative.   Genitourinary: Negative.   Musculoskeletal: Negative.   Skin: Positive for rash. Negative for color change, pallor and wound.  Allergic/Immunologic: Negative.   Neurological: Negative.   Hematological: Negative.   Psychiatric/Behavioral: Negative.   All other systems reviewed and are negative.     Objective:    Physical Exam  Constitutional:  She is oriented to person, place, and time. She appears well-developed and well-nourished. No distress.  Cardiovascular: Normal rate and regular rhythm.  Pulmonary/Chest: No respiratory distress.  Neurological: She is alert and oriented to person, place, and time.  Skin: Skin is warm and dry. Rash noted. Rash is nodular. She is not diaphoretic. There is erythema. No pallor.  Face  Psychiatric: She has a normal mood and affect. Her behavior is normal. Judgment and thought content normal.    BP 118/76   Pulse 66   Temp 97.6 F (36.4 C) (Oral)   Resp 16   Ht 5' (1.524 m)   Wt 138 lb (62.6 kg)   LMP 01/12/2012   SpO2 98%   BMI 26.95 kg/m  Wt Readings from Last 3 Encounters:  12/23/18 138 lb (62.6 kg)  01/11/17 140 lb (63.5 kg)  09/26/16 139 lb (63 kg)     Health Maintenance Due  Topic Date Due  . Hepatitis C Screening  05/13/1955  . HIV Screening  03/26/1972  . TETANUS/TDAP  03/26/1976  . MAMMOGRAM  03/27/2007    There are no preventive care reminders to display for this patient.  No results found for: TSH Lab Results  Component Value Date   WBC 5.0 01/08/2017   HGB 15.1 01/08/2017   HCT 45.4 01/08/2017   MCV 89 01/08/2017   PLT 127 (L) 01/08/2017   Lab Results  Component Value Date   NA 148 (H) 01/08/2017   K 4.7 01/08/2017   CO2 23 01/08/2017   GLUCOSE 93 01/08/2017   BUN 24 01/08/2017   CREATININE 0.75 01/08/2017   BILITOT 0.4 01/08/2017   ALKPHOS 48 01/08/2017   AST 15 01/08/2017   ALT 9 01/08/2017   PROT 7.3 01/08/2017   ALBUMIN 4.7 01/08/2017   CALCIUM 9.3 01/08/2017   Lab Results  Component Value Date   CHOL 185 01/08/2017   Lab Results  Component Value Date   HDL 53 01/08/2017   Lab Results  Component Value Date   LDLCALC 118 (H) 01/08/2017   Lab Results  Component Value Date   TRIG 72 01/08/2017   No results found for: CHOLHDL No results found for: HGBA1C    Assessment & Plan:   Problem List Items Addressed This Visit    None     Visit Diagnoses    Acne, unspecified acne type    -  Primary   Relevant Medications   benzoyl peroxide-erythromycin (BENZAMYCIN) gel   Other Relevant Orders   ANA   Screening for endocrine, metabolic and immunity disorder       Relevant Orders  CBC with Differential/Platelet   Comprehensive metabolic panel   TSH   Lipid screening       Relevant Orders   Lipid panel      Meds ordered this encounter  Medications  . benzoyl peroxide-erythromycin (BENZAMYCIN) gel    Sig: Apply topically 2 (two) times daily.    Dispense:  23.3 g    Refill:  0    Order Specific Question:   Supervising Provider    Answer:   Forrest Moron [id number]    Follow-up: No follow-ups on file.   PLAN  Pt requests CMP, CBC, TSH, and lipid panel today to rule out causes for acne.  Will also draw ANA to rule out SLE  Feel that this is likely of bacterial or hormonal origin, pt is self pay, will trial erythromycin-benzoyl peroxide before further workup.   Return if symptoms worsen or fail to improve  Patient encouraged to call clinic with any questions, comments, or concerns.   Maximiano Coss, NP

## 2018-12-24 ENCOUNTER — Encounter: Payer: Self-pay | Admitting: Registered Nurse

## 2018-12-24 DIAGNOSIS — R768 Other specified abnormal immunological findings in serum: Secondary | ICD-10-CM

## 2018-12-24 DIAGNOSIS — E0590 Thyrotoxicosis, unspecified without thyrotoxic crisis or storm: Secondary | ICD-10-CM

## 2018-12-24 LAB — CBC WITH DIFFERENTIAL/PLATELET
Basophils Absolute: 0 10*3/uL (ref 0.0–0.2)
Basos: 0 %
EOS (ABSOLUTE): 0.4 10*3/uL (ref 0.0–0.4)
Eos: 6 %
Hematocrit: 47.7 % — ABNORMAL HIGH (ref 34.0–46.6)
Hemoglobin: 15.3 g/dL (ref 11.1–15.9)
Immature Grans (Abs): 0 10*3/uL (ref 0.0–0.1)
Immature Granulocytes: 0 %
Lymphocytes Absolute: 2.6 10*3/uL (ref 0.7–3.1)
Lymphs: 45 %
MCH: 27.4 pg (ref 26.6–33.0)
MCHC: 32.1 g/dL (ref 31.5–35.7)
MCV: 86 fL (ref 79–97)
Monocytes Absolute: 0.4 10*3/uL (ref 0.1–0.9)
Monocytes: 7 %
Neutrophils Absolute: 2.4 10*3/uL (ref 1.4–7.0)
Neutrophils: 42 %
Platelets: 106 10*3/uL — ABNORMAL LOW (ref 150–450)
RBC: 5.58 x10E6/uL — ABNORMAL HIGH (ref 3.77–5.28)
RDW: 13.7 % (ref 11.7–15.4)
WBC: 5.8 10*3/uL (ref 3.4–10.8)

## 2018-12-24 LAB — LIPID PANEL
Chol/HDL Ratio: 3 ratio (ref 0.0–4.4)
Cholesterol, Total: 170 mg/dL (ref 100–199)
HDL: 57 mg/dL (ref >39)
LDL Chol Calc (NIH): 93 mg/dL (ref 0–99)
Triglycerides: 109 mg/dL (ref 0–149)
VLDL Cholesterol Cal: 20 mg/dL (ref 5–40)

## 2018-12-24 LAB — COMPREHENSIVE METABOLIC PANEL
ALT: 19 IU/L (ref 0–32)
AST: 20 IU/L (ref 0–40)
Albumin/Globulin Ratio: 1.6 (ref 1.2–2.2)
Albumin: 4.5 g/dL (ref 3.8–4.8)
Alkaline Phosphatase: 84 IU/L (ref 39–117)
BUN/Creatinine Ratio: 22 (ref 12–28)
BUN: 14 mg/dL (ref 8–27)
Bilirubin Total: 0.6 mg/dL (ref 0.0–1.2)
CO2: 28 mmol/L (ref 20–29)
Calcium: 9.8 mg/dL (ref 8.7–10.3)
Chloride: 102 mmol/L (ref 96–106)
Creatinine, Ser: 0.63 mg/dL (ref 0.57–1.00)
GFR calc Af Amer: 112 mL/min/{1.73_m2} (ref >59)
GFR calc non Af Amer: 97 mL/min/{1.73_m2} (ref >59)
Globulin, Total: 2.9 g/dL (ref 1.5–4.5)
Glucose: 92 mg/dL (ref 65–99)
Potassium: 4.8 mmol/L (ref 3.5–5.2)
Sodium: 142 mmol/L (ref 134–144)
Total Protein: 7.4 g/dL (ref 6.0–8.5)

## 2018-12-24 LAB — TSH: TSH: <0.005 u[IU]/mL — ABNORMAL LOW (ref 0.450–4.500)

## 2018-12-24 LAB — ANA: Anti Nuclear Antibody (ANA): POSITIVE — AB

## 2018-12-24 NOTE — Progress Notes (Signed)
Results in - TSH extremely low, ANA positive. Needs Endo and Rheum referrals. Sent with this letter. Called patient with [employer] [phone], left VM explaining. Printed letter and mailed to patient with english explanation and vietnamese translation.  Kathrin Ruddy, NP

## 2018-12-25 ENCOUNTER — Telehealth: Payer: Self-pay | Admitting: Registered Nurse

## 2018-12-25 NOTE — Telephone Encounter (Signed)
Called patient, spoke with daughter and [employer] [phone] Discussed results - extremely low TSH, elevated ANA. Pt hesitant to pursue treatment due to not having insurance. Discussed the importance of addressing both of these needs, because while her current symptoms are mild, they may eventually be fatal.  Kathrin Ruddy, NP

## 2019-01-24 ENCOUNTER — Other Ambulatory Visit: Payer: Self-pay

## 2019-01-24 ENCOUNTER — Encounter: Payer: Self-pay | Admitting: Internal Medicine

## 2019-01-24 ENCOUNTER — Ambulatory Visit (HOSPITAL_BASED_OUTPATIENT_CLINIC_OR_DEPARTMENT_OTHER): Payer: Self-pay | Admitting: Pharmacist

## 2019-01-24 ENCOUNTER — Ambulatory Visit: Payer: Self-pay | Attending: Internal Medicine | Admitting: Internal Medicine

## 2019-01-24 VITALS — BP 140/90 | HR 65 | Temp 97.8°F | Resp 16 | Ht 60.0 in | Wt 139.2 lb

## 2019-01-24 DIAGNOSIS — R768 Other specified abnormal immunological findings in serum: Secondary | ICD-10-CM

## 2019-01-24 DIAGNOSIS — Z1231 Encounter for screening mammogram for malignant neoplasm of breast: Secondary | ICD-10-CM

## 2019-01-24 DIAGNOSIS — R030 Elevated blood-pressure reading, without diagnosis of hypertension: Secondary | ICD-10-CM

## 2019-01-24 DIAGNOSIS — Z23 Encounter for immunization: Secondary | ICD-10-CM

## 2019-01-24 DIAGNOSIS — R7989 Other specified abnormal findings of blood chemistry: Secondary | ICD-10-CM

## 2019-01-24 DIAGNOSIS — D696 Thrombocytopenia, unspecified: Secondary | ICD-10-CM

## 2019-01-24 MED ORDER — METHIMAZOLE 10 MG PO TABS: 10.0000 mg | ORAL_TABLET | Freq: Every day | ORAL | 1 refills | Status: DC

## 2019-01-24 NOTE — Progress Notes (Signed)
Patient ID: Beth Brooks, female    DOB: December 10, 1955  MRN: [id number]  CC: New Patient (Initial Visit)   Subjective: Beth Brooks is a 63 y.o. female who presents for new pt visit.  Pt is [place].  Interpreter, Marva Panda, from [place] is with her. Daughter, Durward Mallard, is also present Her concerns today include:   Previous primary was at [facility].  Had OC at the time.  Seen at [facility] last mth for acne and had to pay out of pocket. Had labs which revealed pos ANA, low PLT (also low 2 yrs ago) and low TSH. Pt reports no wgh changes over past several mths.  Endorses daily palpitations that last 5 minutes about 2 x a day.  No hair loss or brittle hair.  No feeling of being hot all the time.  No diarrhea or constipation.  No jt pain or swelling.  No fever.  When seen a [facility], she had generalized facial rash that was treated as acne.  Rash has resolved.  Denies any previous rash.  No rash with exposure to sun Low PLT:  pt denies any known prior hx.  No easy bruising or bleeding  BP elev today at 155/93.  No hx of HTN in past. No HA or dizziness No CP/SOB  HM:  Had free flu vac 3 wks ago at a community event.    Past medical, social, surgical and family histories reviewed.  Patient Active Problem List   Diagnosis Date Noted  . Overweight 01/11/2017  . GERD (gastroesophageal reflux disease) 01/17/2012     Current Outpatient Medications on File Prior to Visit  Medication Sig Dispense Refill  . benzoyl peroxide-erythromycin (BENZAMYCIN) gel Apply topically 2 (two) times daily. 23.3 g 0  . calcium citrate-vitamin D 500-400 MG-UNIT chewable tablet Chew 1 tablet by mouth 2 (two) times daily.    . cetirizine (ZYRTEC) 10 MG tablet Take 1 tablet (10 mg total) by mouth daily. 30 tablet 11  . Multiple Vitamin (MULTIVITAMIN) tablet Take 1 tablet by mouth daily.    . Omega-3 Fatty Acids (FISH OIL PO) Take by mouth daily.     No current facility-administered medications on file prior to  visit.     No Known Allergies  Social History   Socioeconomic History  . Marital status: Married    Spouse name: Eddie Dibbles Adup  . Number of children: 8  . Years of education: 8  . Highest education level: Not on file  Occupational History  . Occupation: Housewife  Social Needs  . Financial resource strain: Not on file  . Food insecurity    Worry: Not on file    Inability: Not on file  . Transportation needs    Medical: Not on file    Non-medical: Not on file  Tobacco Use  . Smoking status: Never Smoker  . Smokeless tobacco: Never Used  Substance and Sexual Activity  . Alcohol use: No  . Drug use: No  . Sexual activity: Not on file    Comment: married  Lifestyle  . Physical activity    Days per week: Not on file    Minutes per session: Not on file  . Stress: Not on file  Relationships  . Social Herbalist on phone: Not on file    Gets together: Not on file    Attends religious service: Not on file    Active member of club or organization: Not on file    Attends  meetings of clubs or organizations: Not on file    Relationship status: Not on file  . Intimate partner violence    Fear of current or ex partner: Not on file    Emotionally abused: Not on file    Physically abused: Not on file    Forced sexual activity: Not on file  Other Topics Concern  . Not on file  Social History Narrative   Originally from [place]   [place]   Came to [facility]. In [place] with her husband and one daughter.   Husband with hepatic carcinoma, for which he has been treated past 3 years.     She has support to give her time off.   Son who interprets takes her husband for chemo twice weekly.    Family History  Problem Relation Age of Onset  . Gestational diabetes Daughter   . Alcohol abuse Brother   . Cerebrovascular Accident Brother        "blood clot on brain"  [place] now.    Past Surgical History:  Procedure Laterality Date  . TUBAL LIGATION  1994    ROS: Review of  Systems Negative except as stated above  PHYSICAL EXAM: BP 140/90   Pulse 65   Temp 97.8 F (36.6 C) (Oral)   Resp 16   Ht 5' (1.524 m)   Wt 139 lb 3.2 oz (63.1 kg)   LMP 01/12/2012   SpO2 96%   BMI 27.19 kg/m   Wt Readings from Last 3 Encounters:  01/24/19 139 lb 3.2 oz (63.1 kg)  12/23/18 138 lb (62.6 kg)  01/11/17 140 lb (63.5 kg)   Physical Exam  General appearance - alert, well appearing, and in no distress Mental status - normal mood, behavior, speech, dress, motor activity, and thought processes Eyes - pupils equal and reactive, extraocular eye movements intact Nose - normal and patent, no erythema, discharge or polyps Mouth - mucous membranes moist, pharynx normal without lesions Neck - supple, no significant adenopathy.  No thyroid enlargement.  No thyroid nodule Chest - clear to auscultation, no wheezes, rales or rhonchi, symmetric air entry Heart - normal rate, regular rhythm, normal S1, S2, no murmurs, rubs, clicks or gallops Extremities - peripheral pulses normal, no pedal edema, no clubbing or cyanosis Skin -no facial rash noted at this time.  Some spider veins noted on the legs. MSK-no signs of active inflammation or joint enlargement of the small joints of the hands wrists, ankles and feet.  She has good range of motion at the knee joints  CMP Latest Ref Rng & Units 12/23/2018 01/08/2017 12/17/2011  Glucose 65 - 99 mg/dL 92 93 97  BUN 8 - 27 mg/dL 14 24 13   Creatinine 0.57 - 1.00 mg/dL 0.63 0.75 0.47(L)  Sodium 134 - 144 mmol/L 142 148(H) 141  Potassium 3.5 - 5.2 mmol/L 4.8 4.7 3.8  Chloride 96 - 106 mmol/L 102 106 104  CO2 20 - 29 mmol/L 28 23 29   Calcium 8.7 - 10.3 mg/dL 9.8 9.3 9.6  Total Protein 6.0 - 8.5 g/dL 7.4 7.3 7.4  Total Bilirubin 0.0 - 1.2 mg/dL 0.6 0.4 0.7  Alkaline Phos 39 - 117 IU/L 84 48 73  AST 0 - 40 IU/L 20 15 16   ALT 0 - 32 IU/L 19 9 14    Lipid Panel     Component Value Date/Time   CHOL 170 12/23/2018 1128   TRIG 109 12/23/2018  1128   HDL 57 12/23/2018 1128  CHOLHDL 3.0 12/23/2018 1128   LDLCALC 93 12/23/2018 1128    CBC    Component Value Date/Time   WBC 5.8 12/23/2018 1128   WBC 6.7 12/17/2011 1131   WBC 7.8 07/23/2010 0001   RBC 5.58 (H) 12/23/2018 1128   RBC 5.65 (A) 12/17/2011 1131   RBC 5.20 (H) 07/23/2010 0001   HGB 15.3 12/23/2018 1128   HCT 47.7 (H) 12/23/2018 1128   PLT 106 (L) 12/23/2018 1128   MCV 86 12/23/2018 1128   MCH 27.4 12/23/2018 1128   MCH 26.7 (A) 12/17/2011 1131   MCH 28.1 07/23/2010 0001   MCHC 32.1 12/23/2018 1128   MCHC 30.4 (A) 12/17/2011 1131   MCHC 33.8 07/23/2010 0001   RDW 13.7 12/23/2018 1128   LYMPHSABS 2.6 12/23/2018 1128   MONOABS 0.7 07/23/2010 0001   EOSABS 0.4 12/23/2018 1128   BASOSABS 0.0 12/23/2018 1128    ASSESSMENT AND PLAN: 1. Abnormal TSH I will repeat TSH along with getting free T3/4 today. Prescription given for Tapazole.  However I have asked her daughter to hold off on filling it until they hear back from me with the results of blood test done today. - TSH+T4F+T3Free - methimazole (TAPAZOLE) 10 MG tablet; Take 1 tablet (10 mg total) by mouth daily.  Dispense: 30 tablet; Refill: 1  2. Positive ANA (antinuclear antibody) Positive ANA nonspecific.  We will have her apply for the orange card/cone discount in the event we need to refer her to a specialist. - ANA w/Reflex if Positive  3. Thrombocytopenia ([place]) Slightly low platelet count which looks like it has been chronic dating back to 2012.  4. Encounter for screening mammogram for malignant neoplasm of breast Given [facility] scholarship form - MM Digital Screening; Future  5. Elevated blood pressure reading I note that blood pressure readings on other visits she has had in the healthcare system were good.  Advised low-salt diet.  We will plan to recheck blood pressure on follow-up visit.     Patient was given the opportunity to ask questions.  Patient verbalized understanding of the plan and  was able to repeat key elements of the plan.   Orders Placed This Encounter  Procedures  . MM Digital Screening  . TSH+T4F+T3Free  . ANA w/Reflex if Positive     Requested Prescriptions   Signed Prescriptions Disp Refills  . methimazole (TAPAZOLE) 10 MG tablet 30 tablet 1    Sig: Take 1 tablet (10 mg total) by mouth daily.    Return in about 6 weeks (around 03/07/2019).  Karle Plumber, MD, FACP

## 2019-01-24 NOTE — Progress Notes (Signed)
Patient presents for vaccination against tetanus per orders of Dr. Johnson. Consent given. Counseling provided. No contraindications exists. Vaccine administered without incident.   

## 2019-01-24 NOTE — Patient Instructions (Signed)
Td Vaccine (Tetanus and Diphtheria): What You Need to Know 1. Why get vaccinated? Tetanus  and diphtheria are very serious diseases. They are rare in the [place] today, but people who do become infected often have severe complications. Td vaccine is used to protect adolescents and adults from both of these diseases. Both tetanus and diphtheria are infections caused by bacteria. Diphtheria spreads from person to person through coughing or sneezing. Tetanus-causing bacteria enter the body through cuts, scratches, or wounds. TETANUS (Lockjaw) causes painful muscle tightening and stiffness, usually all over the body.  It can lead to tightening of muscles in the head and neck so you can't open your mouth, swallow, or sometimes even breathe. Tetanus kills about 1 out of every 10 people who are infected even after receiving the best medical care. DIPHTHERIA can cause a thick coating to form in the back of the throat.  It can lead to breathing problems, paralysis, heart failure, and death. Before vaccines, as many as 200,000 cases of diphtheria and hundreds of cases of tetanus were reported in the [place] each year. Since vaccination began, reports of cases for both diseases have dropped by about 99%. 2. Td vaccine Td vaccine can protect adolescents and adults from tetanus and diphtheria. Td is usually given as a booster dose every 10 years but it can also be given earlier after a severe and dirty wound or burn. Another vaccine, called Tdap, which protects against pertussis in addition to tetanus and diphtheria, is sometimes recommended instead of Td vaccine. Your doctor or the person giving you the vaccine can give you more information. Td may safely be given at the same time as other vaccines. 3. Some people should not get this vaccine  A person who has ever had a life-threatening allergic reaction after a previous dose of any tetanus or diphtheria containing vaccine, OR has a severe allergy  to any part of this vaccine, should not get Td vaccine. Tell the person giving the vaccine about any severe allergies.  Talk to your doctor if you: ? had severe pain or swelling after any vaccine containing diphtheria or tetanus, ? ever had a condition called Guillain Barr Syndrome (GBS), ? aren't feeling well on the day the shot is scheduled. 4. Risks of a vaccine reaction With any medicine, including vaccines, there is a chance of side effects. These are usually mild and go away on their own. Serious reactions are also possible but are rare. Most people who get Td vaccine do not have any problems with it. Mild Problems following Td vaccine: (Did not interfere with activities)  Pain where the shot was given (about 8 people in 10)  Redness or swelling where the shot was given (about 1 person in 4)  Mild fever (rare)  Headache (about 1 person in 4)  Tiredness (about 1 person in 4) Moderate Problems following Td vaccine: (Interfered with activities, but did not require medical attention)  Fever over 102F (rare) Severe Problems following Td vaccine: (Unable to perform usual activities; required medical attention)  Swelling, severe pain, bleeding and/or redness in the arm where the shot was given (rare). Problems that could happen after any vaccine:  People sometimes faint after a medical procedure, including vaccination. Sitting or lying down for about 15 minutes can help prevent fainting, and injuries caused by a fall. Tell your doctor if you feel dizzy, or have vision changes or ringing in the ears.  Some people get severe pain in the shoulder and have   difficulty moving the arm where a shot was given. This happens very rarely.  Any medication can cause a severe allergic reaction. Such reactions from a vaccine are very rare, estimated at fewer than 1 in a million doses, and would happen within a few minutes to a few hours after the vaccination. As with any medicine, there is a  very remote chance of a vaccine causing a serious injury or death. The safety of vaccines is always being monitored. For more information, visit: www.cdc.gov/vaccinesafety/ 5. What if there is a serious reaction? What should I look for?  Look for anything that concerns you, such as signs of a severe allergic reaction, very high fever, or unusual behavior. Signs of a severe allergic reaction can include hives, swelling of the face and throat, difficulty breathing, a fast heartbeat, dizziness, and weakness. These would usually start a few minutes to a few hours after the vaccination. What should I do?  If you think it is a severe allergic reaction or other emergency that can't wait, call 9-1-1 or get the person to the nearest hospital. Otherwise, call your doctor.  Afterward, the reaction should be reported to the Vaccine Adverse Event Reporting System (VAERS). Your doctor might file this report, or you can do it yourself through the VAERS web site at www.vaers.hhs.gov, or by calling 1-800-822-7967. VAERS does not give medical advice. 6. The National Vaccine Injury Compensation Program The National Vaccine Injury Compensation Program (VICP) is a federal program that was created to compensate people who may have been injured by certain vaccines. Persons who believe they may have been injured by a vaccine can learn about the program and about filing a claim by calling 1-800-338-2382 or visiting the VICP website at www.hrsa.gov/vaccinecompensation. There is a time limit to file a claim for compensation. 7. How can I learn more?  Ask your doctor. He or she can give you the vaccine package insert or suggest other sources of information.  Call your local or state health department.  Contact the Centers for Disease Control and Prevention (CDC): ? Call 1-800-232-4636 (1-800-CDC-INFO) ? Visit CDC's website at www.cdc.gov/vaccines Vaccine Information Statement Td Vaccine (07/06/15) This information is  not intended to replace advice given to you by your health care provider. Make sure you discuss any questions you have with your health care provider. Document Released: 01/08/2006 Document Revised: 10/29/2017 Document Reviewed: 10/29/2017 Elsevier Interactive Patient Education  2020 [employer]vier Inc.  

## 2019-01-25 LAB — ANA W/REFLEX IF POSITIVE
Anti JO-1: <0.2 AI (ref 0.0–0.9)
Anti Nuclear Antibody (ANA): POSITIVE — AB
Centromere Ab Screen: <0.2 AI (ref 0.0–0.9)
Chromatin Ab SerPl-aCnc: <0.2 AI (ref 0.0–0.9)
ENA RNP Ab: <0.2 AI (ref 0.0–0.9)
ENA SM Ab Ser-aCnc: <0.2 AI (ref 0.0–0.9)
ENA SSA (RO) Ab: >8 AI — ABNORMAL HIGH (ref 0.0–0.9)
ENA SSB (LA) Ab: <0.2 AI (ref 0.0–0.9)
Scleroderma (Scl-70) (ENA) Antibody, IgG: <0.2 AI (ref 0.0–0.9)
dsDNA Ab: 1 IU/mL (ref 0–9)

## 2019-01-25 LAB — TSH+T4F+T3FREE
Free T4: 1.25 ng/dL (ref 0.82–1.77)
T3, Free: 2.6 pg/mL (ref 2.0–4.4)
TSH: 0.006 u[IU]/mL — ABNORMAL LOW (ref 0.450–4.500)

## 2019-01-28 ENCOUNTER — Telehealth: Payer: Self-pay

## 2019-01-28 NOTE — Telephone Encounter (Signed)
Contacted pt daughter to go over lab results pt daughter didn't answer lvm asking her to give me a call back at her earliest convenience

## 2019-03-07 ENCOUNTER — Other Ambulatory Visit: Payer: Self-pay

## 2019-03-07 ENCOUNTER — Ambulatory Visit: Payer: Self-pay | Attending: Internal Medicine | Admitting: Internal Medicine

## 2019-05-27 ENCOUNTER — Other Ambulatory Visit: Payer: Self-pay

## 2019-05-27 ENCOUNTER — Ambulatory Visit: Payer: Self-pay | Attending: Family | Admitting: Family

## 2019-05-27 ENCOUNTER — Encounter: Payer: Self-pay | Admitting: Family

## 2019-05-27 VITALS — BP 120/81 | HR 66 | Temp 97.7°F | Wt 146.0 lb

## 2019-05-27 DIAGNOSIS — R7989 Other specified abnormal findings of blood chemistry: Secondary | ICD-10-CM

## 2019-05-27 NOTE — Progress Notes (Signed)
Patient ID: Beth Brooks, female    DOB: Mar 28, 1955  MRN: [id number]  CC: Thyroid follow-up  Subjective: Beth Brooks is a 64 y.o. female with history of GERD, abnormal TSH, positive ANA, and thrombocytopenia who presents for abnormal TSH follow-up.  Patient accompanied by her daughter Beth Brooks as an interpreter.  1. Hypothyroidism Follow-up:  Patient presents for evaluation of thyroid function. Symptoms consist of fatigue, weight gain, feeling cold and cold intolerance, palpitations for at least 2 hours when feeling tired. Symptoms have present for 5 months. The symptoms are moderate.  The problem has been gradually worsening.  Previous thyroid studies include TSH, triiodothyronine free and T4 free.  Denies chest pain, shortness of breath, radiation to the head/neck/shoulders.    Last visit October 2020 with Dr. Wynetta Emery during that encounter TSH and free T3/4 were drawn and methimazole was prescribed.  Patient and her daughter received a paper prescription and told to not fill the prescription until they heard back from the doctor with results.    Once the results returned the medical assistant called the patient's daughter and left voicemail with no response from the patient's daughter. After the lab results returned Dr. Wynetta Emery recommended referral to endocrinologist for further evaluation and management as well as applying for the orange card/[place] discount.  Patient's daughter states that she filled out the orange card application in October [id number] but was unable to make an appointment with the endocrinologist because the application was not processed.     Patient Active Problem List   Diagnosis Date Noted  . Abnormal TSH 01/24/2019  . Positive ANA (antinuclear antibody) 01/24/2019  . Thrombocytopenia ([facility]) 01/24/2019  . Overweight 01/11/2017  . GERD (gastroesophageal reflux disease) 01/17/2012     Current Outpatient Medications on File Prior to Visit  Medication Sig  Dispense Refill  . benzoyl peroxide-erythromycin (BENZAMYCIN) gel Apply topically 2 (two) times daily. 23.3 g 0  . calcium citrate-vitamin D 500-400 MG-UNIT chewable tablet Chew 1 tablet by mouth 2 (two) times daily.    . cetirizine (ZYRTEC) 10 MG tablet Take 1 tablet (10 mg total) by mouth daily. 30 tablet 11  . methimazole (TAPAZOLE) 10 MG tablet Take 1 tablet (10 mg total) by mouth daily. 30 tablet 1  . Multiple Vitamin (MULTIVITAMIN) tablet Take 1 tablet by mouth daily.    . Omega-3 Fatty Acids (FISH OIL PO) Take by mouth daily.     No current facility-administered medications on file prior to visit.    No Known Allergies  Social History   Socioeconomic History  . Marital status: Married    Spouse name: Eddie Dibbles Adup  . Number of children: 8  . Years of education: 8  . Highest education level: Not on file  Occupational History  . Occupation: Housewife  Tobacco Use  . Smoking status: Never Smoker  . Smokeless tobacco: Never Used  Substance and Sexual Activity  . Alcohol use: No  . Drug use: No  . Sexual activity: Not on file    Comment: married  Other Topics Concern  . Not on file  Social History Narrative   Originally from [place]   [place]   Came to [place] In [place] with her husband and one daughter.   Husband with hepatic carcinoma, for which he has been treated past 3 years.     She has support to give her time off.   Son who interprets takes her husband for chemo twice weekly.   Social Determinants  of Health   Financial Resource Strain:   . Difficulty of Paying Living Expenses: Not on file  Food Insecurity:   . Worried About Charity fundraiser in the Last Year: Not on file  . Ran Out of Food in the Last Year: Not on file  Transportation Needs:   . Lack of Transportation (Medical): Not on file  . Lack of Transportation (Non-Medical): Not on file  Physical Activity:   . Days of Exercise per Week: Not on file  . Minutes of Exercise per Session: Not on file    Stress:   . Feeling of Stress : Not on file  Social Connections:   . Frequency of Communication with Friends and Family: Not on file  . Frequency of Social Gatherings with Friends and Family: Not on file  . Attends Religious Services: Not on file  . Active Member of Clubs or Organizations: Not on file  . Attends Archivist Meetings: Not on file  . Marital Status: Not on file  Intimate Partner Violence:   . Fear of Current or Ex-Partner: Not on file  . Emotionally Abused: Not on file  . Physically Abused: Not on file  . Sexually Abused: Not on file    Family History  Problem Relation Age of Onset  . Gestational diabetes Daughter   . Alcohol abuse Brother   . Cerebrovascular Accident Brother        "blood clot on brain"  [place] now.    Past Surgical History:  Procedure Laterality Date  . TUBAL LIGATION  1994    ROS: Review of Systems Negative except as stated above  PHYSICAL EXAM: Vitals with BMI 05/27/2019 01/24/2019 01/24/2019  Height - - 5\' 0"   Weight 146 lbs - 139 lbs 3 oz  BMI - - 0000000  Systolic [id number]  Diastolic 81 90 93  Pulse 66 - 65  Temperature: 97.7 F SpO2: 96%, room air  Physical Exam General appearance - alert, well appearing, and in no distress Mental status - alert, oriented to person, place, and time, normal mood, behavior, speech, dress, motor activity, and thought processes Neck - supple, no significant adenopathy, thyroid exam: thyroid is normal in size without nodules or tenderness Lymphatics - no palpable lymphadenopathy, no hepatosplenomegaly Chest - clear to auscultation, no wheezes, rales or rhonchi, symmetric air entry, no tachypnea, retractions or cyanosis Heart - normal rate, regular rhythm, normal S1, S2, no murmurs, rubs, clicks or gallops   CMP Latest Ref Rng & Units 12/23/2018 01/08/2017 12/17/2011  Glucose 65 - 99 mg/dL 92 93 97  BUN 8 - 27 mg/dL 14 24 13   Creatinine 0.57 - 1.00 mg/dL 0.63 0.75 0.47(L)  Sodium 134 - 144  mmol/L 142 148(H) 141  Potassium 3.5 - 5.2 mmol/L 4.8 4.7 3.8  Chloride 96 - 106 mmol/L 102 106 104  CO2 20 - 29 mmol/L 28 23 29   Calcium 8.7 - 10.3 mg/dL 9.8 9.3 9.6  Total Protein 6.0 - 8.5 g/dL 7.4 7.3 7.4  Total Bilirubin 0.0 - 1.2 mg/dL 0.6 0.4 0.7  Alkaline Phos 39 - 117 IU/L 84 48 73  AST 0 - 40 IU/L 20 15 16   ALT 0 - 32 IU/L 19 9 14    Lipid Panel     Component Value Date/Time   CHOL 170 12/23/2018 1128   TRIG 109 12/23/2018 1128   HDL 57 12/23/2018 1128   CHOLHDL 3.0 12/23/2018 1128   LDLCALC 93 12/23/2018 1128    CBC  Component Value Date/Time   WBC 5.8 12/23/2018 1128   WBC 6.7 12/17/2011 1131   WBC 7.8 07/23/2010 0001   RBC 5.58 (H) 12/23/2018 1128   RBC 5.65 (A) 12/17/2011 1131   RBC 5.20 (H) 07/23/2010 0001   HGB 15.3 12/23/2018 1128   HCT 47.7 (H) 12/23/2018 1128   PLT 106 (L) 12/23/2018 1128   MCV 86 12/23/2018 1128   MCH 27.4 12/23/2018 1128   MCH 26.7 (A) 12/17/2011 1131   MCH 28.1 07/23/2010 0001   MCHC 32.1 12/23/2018 1128   MCHC 30.4 (A) 12/17/2011 1131   MCHC 33.8 07/23/2010 0001   RDW 13.7 12/23/2018 1128   LYMPHSABS 2.6 12/23/2018 1128   MONOABS 0.7 07/23/2010 0001   EOSABS 0.4 12/23/2018 1128   BASOSABS 0.0 12/23/2018 1128    ASSESSMENT AND PLAN: 1. Abnormal TSH: - TSH+T4F+T3Free -Counseled patient to hold methimazole prescription until today's labs result. Patient's daughter will be contacted with results and further instructions. -Referral to endocrinology still advised to determine if patient has primary hyperthyroidism versus hypothyroidism related to a secondary cause.  Counseled patient's daughter to fill out the orange card/[place] discount application again and to make an appointment with the financial counselor before she leaves on today, patient's daughter agrees.   Patient was given the opportunity to ask questions.  Patient verbalized understanding of the plan and was able to repeat key elements of the plan. Patient was  given clear instructions to go to Emergency Department or return to medical center if symptoms don't improve, worsen, or new problems develop.The patient verbalized understanding.    Requested Prescriptions    No prescriptions requested or ordered in this encounter    Niani Mourer Zachery Dauer, NP

## 2019-05-27 NOTE — Patient Instructions (Signed)
Labs today for thyroid.  Hypothyroidism  Hypothyroidism is when the thyroid gland does not make enough of certain hormones (it is underactive). The thyroid gland is a small gland located in the lower front part of the neck, just in front of the windpipe (trachea). This gland makes hormones that help control how the body uses food for energy (metabolism) as well as how the heart and brain function. These hormones also play a role in keeping your bones strong. When the thyroid is underactive, it produces too little of the hormones thyroxine (T4) and triiodothyronine (T3). What are the causes? This condition may be caused by:  Hashimoto's disease. This is a disease in which the body's disease-fighting system (immune system) attacks the thyroid gland. This is the most common cause.  Viral infections.  Pregnancy.  Certain medicines.  Birth defects.  Past radiation treatments to the head or neck for cancer.  Past treatment with radioactive iodine.  Past exposure to radiation in the environment.  Past surgical removal of part or all of the thyroid.  Problems with a gland in the center of the brain (pituitary gland).  Lack of enough iodine in the diet. What increases the risk? You are more likely to develop this condition if:  You are female.  You have a family history of thyroid conditions.  You use a medicine called lithium.  You take medicines that affect the immune system (immunosuppressants). What are the signs or symptoms? Symptoms of this condition include:  Feeling as though you have no energy (lethargy).  Not being able to tolerate cold.  Weight gain that is not explained by a change in diet or exercise habits.  Lack of appetite.  Dry skin.  Coarse hair.  Menstrual irregularity.  Slowing of thought processes.  Constipation.  Sadness or depression. How is this diagnosed? This condition may be diagnosed based on:  Your symptoms, your medical history, and  a physical exam.  Blood tests. You may also have imaging tests, such as an ultrasound or MRI. How is this treated? This condition is treated with medicine that replaces the thyroid hormones that your body does not make. After you begin treatment, it may take several weeks for symptoms to go away. Follow these instructions at home:  Take over-the-counter and prescription medicines only as told by your health care provider.  If you start taking any new medicines, tell your health care provider.  Keep all follow-up visits as told by your health care provider. This is important. ? As your condition improves, your dosage of thyroid hormone medicine may change. ? You will need to have blood tests regularly so that your health care provider can monitor your condition. Contact a health care provider if:  Your symptoms do not get better with treatment.  You are taking thyroid replacement medicine and you: ? Sweat a lot. ? Have tremors. ? Feel anxious. ? Lose weight rapidly. ? Cannot tolerate heat. ? Have emotional swings. ? Have diarrhea. ? Feel weak. Get help right away if you have:  Chest pain.  An irregular heartbeat.  A rapid heartbeat.  Difficulty breathing. Summary  Hypothyroidism is when the thyroid gland does not make enough of certain hormones (it is underactive).  When the thyroid is underactive, it produces too little of the hormones thyroxine (T4) and triiodothyronine (T3).  The most common cause is Hashimoto's disease, a disease in which the body's disease-fighting system (immune system) attacks the thyroid gland. The condition can also be caused by viral  infections, medicine, pregnancy, or past radiation treatment to the head or neck.  Symptoms may include weight gain, dry skin, constipation, feeling as though you do not have energy, and not being able to tolerate cold.  This condition is treated with medicine to replace the thyroid hormones that your body does not  make. This information is not intended to replace advice given to you by your health care provider. Make sure you discuss any questions you have with your health care provider. Document Revised: 02/23/2017 Document Reviewed: 02/21/2017 Elsevier Patient Education  2020 [employer].

## 2019-05-28 ENCOUNTER — Other Ambulatory Visit: Payer: Self-pay | Admitting: Internal Medicine

## 2019-05-28 LAB — TSH+T4F+T3FREE
Free T4: 1.32 ng/dL (ref 0.82–1.77)
T3, Free: 3 pg/mL (ref 2.0–4.4)
TSH: 1.4 u[IU]/mL (ref 0.450–4.500)

## 2019-05-28 NOTE — Progress Notes (Signed)
Thyroid panel normal. Please call patient with update.

## 2019-05-30 ENCOUNTER — Telehealth: Payer: Self-pay | Admitting: Internal Medicine

## 2019-05-30 NOTE — Telephone Encounter (Signed)
Patient called and requested for lab results. Patient was informed, verbalized understanding and had no further questions.

## 2019-07-21 ENCOUNTER — Other Ambulatory Visit: Payer: Self-pay

## 2019-07-21 ENCOUNTER — Ambulatory Visit: Payer: Self-pay

## 2020-04-30 ENCOUNTER — Encounter: Payer: Self-pay | Admitting: Family Medicine

## 2020-04-30 ENCOUNTER — Other Ambulatory Visit: Payer: Self-pay

## 2020-04-30 ENCOUNTER — Ambulatory Visit (INDEPENDENT_AMBULATORY_CARE_PROVIDER_SITE_OTHER): Payer: 59 | Admitting: Family Medicine

## 2020-04-30 VITALS — BP 108/69 | HR 79 | Temp 98.7°F | Resp 15 | Ht 59.0 in | Wt 134.6 lb

## 2020-04-30 DIAGNOSIS — Z0000 Encounter for general adult medical examination without abnormal findings: Secondary | ICD-10-CM

## 2020-04-30 DIAGNOSIS — R5383 Other fatigue: Secondary | ICD-10-CM | POA: Diagnosis not present

## 2020-04-30 DIAGNOSIS — Z0001 Encounter for general adult medical examination with abnormal findings: Secondary | ICD-10-CM

## 2020-04-30 DIAGNOSIS — R079 Chest pain, unspecified: Secondary | ICD-10-CM

## 2020-04-30 NOTE — Patient Instructions (Addendum)
Everything looks good on your physical examination today.  We are drawing blood tests on you and we will let you know the results of those in the next few days or week or so.  We have scheduled you for a mammogram and someone should contact you about that appointment.  If you do not hear about that over the next week or 2, please call the office and ask what happened to the mammogram was scheduled.  Her heart seems to be fine on examination and on the EKG.  There is no significant change from the EKG about 8 years ago.  It has been 8 years since she had the colonoscopy examination, so in about 2 years that should be repeated.  Fatigue comes from many reasons (tiredness).  I encouraged her to continue to stay active and keep trying to walk a lot.  She can take Tylenol or Aleve or Advil as needed for aches and pains.  If she is having the heartburn, she can take over-the-counter Prilosec (omeprazole) or pantoprazole (Protonix).  Get rechecked if further problems arise.  If chest pains are getting worse we can refer her to a cardiologist, but I do not believe that the chest pains are from her heart and do not think it is necessary at this time.  As I discussed with you, this office is being closed at the end of March by [facility].  You will need to find another family physician or primary care doctor.  I assume that when the hospital sends out the letter that they will probably give phone numbers that you can contact to make appointments.  From Google translate: M?i th? c v? t?t trong bu?i khm s?c kh?e c?a b?n hm nay.  Chng ti ?ang ti?n hnh xt nghi?m mu ??i v?i b?n v chng ti s? cho b?n bi?t k?t qu? c?a nh?ng xt nghi?m ? trong vi ngy ho?c tu?n t?i ho?c lu h?n.  Chng ti ? ln l?ch cho b?n ch?p X-quang tuy?n v v ai ? s? lin h? v?i b?n v? cu?c h?n ?. N?u b?n khng bi?t v? ?i?u ? trong m?t ho?c 2 tu?n t?i, vui lng g?i cho v?n phng v h?i xem ?i?u g ? x?y ra v?i  vi?c ch?p X-quang tuy?n v ? ???c ln l?ch.  Tim c ?y c v? ?n khi khm v trn ?i?n tm ??. Khng c thay ??i ?ng k? no so v?i EKG kho?ng 8 n?m tr??c.  ? 8 n?m k? t? khi c khm n?i soi, v v?y trong kho?ng 2 n?m n?a ?i?u ? nn ???c l?p l?i.  M?t m?i ??n t? nhi?u l do (m?t m?i). Ti ? ??ng vin c ?y ti?p t?c v?n ??ng v ti?p t?c c? g?ng ?i b? th?t nhi?u.  C ?y c th? dng Tylenol ho?c Aleve ho?c Advil n?u c?n ??i v?i cc c?n ?au nh?c.  N?u b? ? chua, c ?y c th? dng Prilosec khng k ??n (omeprazole) ho?c pantoprazole (Protonix).  ???c ki?m tra l?i n?u pht sinh thm v?n ??. N?u c?n ?au ng?c ngy cng tr?m tr?ng h?n, chng ti c th? gi?i thi?u c ?y ??n bc s? tim m?ch, nh?ng ti khng tin r?ng nh?ng c?n ?au ng?c l do tim c?a c ?y v khng ngh? l c?n thi?t vo lc ny.  Nh? ti ? th?o lu?n v?i b?n, v?n phng ny s? b? b?nh vi?n [facility] ?ng c?a vo cu?i thng Ba. B?n s? c?n tm m?t bc s?  gia ?nh ho?c bc s? ch?m [place] [address] chnh khc. Ti gi? ??nh r?ng khi b?nh vi?n g?i th?, h? c th? s? cung c?p s? ?i?n tho?i m b?n c th? lin h? ?? ??t l?ch h?n.   If you have lab work done today you will be contacted with your lab results within the next 2 weeks.  If you have not heard from [place] then please contact us. The fastest way to get your results is to register for My Chart.   IF you received an x-ray today, you will receive an invoice from [facility]. Please contact [facility] at [phone] with questions or concerns regarding your invoice.   IF you received labwork today, you will receive an invoice from [facility]. Please contact [facility] at [phone] with questions or concerns regarding your invoice.   Our billing staff will not be able to assist you with questions regarding bills from these companies.  You will be contacted with the lab results as soon as they are available. The fastest way to get your results is to activate your My Chart account. Instructions  are located on the last page of this paperwork. If you have not heard from [place] regarding the results in 2 weeks, please contact this office.

## 2020-04-30 NOTE — Progress Notes (Signed)
Patient ID: Beth Brooks, female    DOB: Mar 22, 1956  Age: 65 y.o. MRN: [id number]  Chief Complaint  Patient presents with  . Annual Exam    Pt here today notes she is having chest pain, and feels her heart is "tired", pt notes this has been last 3-4 days on and off. Pt also would like lab work done to check thyroid levels as she was noted to have off levels previously but never treated for it     Subjective:   Annual physical examination:  65 year old [place] lady who is here for her annual physical exam.  She does not speak English but her daughter was with her and can translate well.  She has been having more fatigue in recent months and when she gets tired she gets some nonspecific left upper chest soreness and pain.  When she is resting it does not bother her.  No major shortness of breath.  No radicular pain.  She stays busy caring for the grandkids and cleaning the house.  She also gets some mild GERD pain but that is a little different in nature she thinks.  Past medical history: Surgeries: None Major medical illnesses: None Gravida 5 para 5 but one is deceased.  3 in the [place] and 1 in [place]. Medications: None Medication allergies: None  Family history: Both parents are deceased.  No family history known from back pain.  Social history: She was widowed about 3 years ago when her husband died of liver cancer.  She is living with her daughter.  Her other several children are here in the [place].  She has 8 grandchildren but several of them are in [place].  She is Catholic and attends a [employer] [place] family services here.  She is a [place] citizen.  Review of systems: Constitutional: Unremarkable except for fatigue HEENT: Unremarkable Cardiovascular: Nonspecific left upper chest pains as noted above Respiratory: Unremarkable GI: Mild epigastric heartburn occasionally, does not take anything much for it. GU: Unremarkable Musculoskeletal: Unremarkable except for getting some  aches and pains for which she takes some Tylenol or Advil. Neurologic: Unremarkable Psychiatric: Unremarkable Dermatologic: Unremarkable Endocrine: Unremarkable     Past medical history:   Current allergies, medications, problem list, past/family and social histories reviewed.  Objective:  BP 108/69   Pulse 79   Temp 98.7 F (37.1 C) (Temporal)   Resp 15   Ht 4\' 11"  (1.499 m)   Wt 134 lb 9.6 oz (61.1 kg)   LMP 01/12/2012   SpO2 96%   BMI 27.19 kg/m  Pleasant lady alert and oriented.  TMs normal.  Eyes PERRL.  Throat clear.  Teeth look fairly good.  Apparently she gets her dental care when she goes to [place] to visit every few years.  Neck supple without nodes or thyromegaly.  No carotid bruits.  Chest is clear to auscultation.  No axillary or inguinal nodes.  Breasts are symmetrical, soft without masses.  Abdomen soft without mass or tenderness.  Pelvic not done.  Extremities unremarkable.  Skin unremarkable.  Reviewed her EKG and it is essentially the same as the one about 8 years ago with some mild nonspecific T wave changes.  Assessment & Plan:   Assessment: 1. Annual physical exam   2. Chest pain, unspecified type   3. Other fatigue       Plan: Check labs and schedule for mammogram.  Flu shot today.  She has had a Covid vaccines.  See instructions. Orders Placed This Encounter  Procedures  . MM Digital Screening    Standing Status:   Future    Standing Expiration Date:   04/30/2021    Order Specific Question:   Reason for Exam (SYMPTOM  OR DIAGNOSIS REQUIRED)    Answer:   screening    Order Specific Question:   Preferred imaging location?    Answer:   [facility]  . Flu Vaccine QUAD 6+ mos PF IM (Fluarix Quad PF)  . CBC with Differential/Platelet  . Comprehensive metabolic panel  . Hemoglobin A1c  . Lipid panel  . TSH  . Hepatitis C antibody  . HIV antibody (with reflex)  . EKG 12-Lead    No orders of the defined types were placed in this  encounter.        Patient Instructions     Everything looks good on your physical examination today.  We are drawing blood tests on you and we will let you know the results of those in the next few days or week or so.  We have scheduled you for a mammogram and someone should contact you about that appointment.  If you do not hear about that over the next week or 2, please call the office and ask what happened to the mammogram was scheduled.  Her heart seems to be fine on examination and on the EKG.  There is no significant change from the EKG about 8 years ago.  It has been 8 years since she had the colonoscopy examination, so in about 2 years that should be repeated.  Fatigue comes from many reasons (tiredness).  I encouraged her to continue to stay active and keep trying to walk a lot.  She can take Tylenol or Aleve or Advil as needed for aches and pains.  If she is having the heartburn, she can take over-the-counter Prilosec (omeprazole) or pantoprazole (Protonix).  Get rechecked if further problems arise.  If chest pains are getting worse we can refer her to a cardiologist, but I do not believe that the chest pains are from her heart and do not think it is necessary at this time.  As I discussed with you, this office is being closed at the end of March by [facility].  You will need to find another family physician or primary care doctor.  I assume that when the hospital sends out the letter that they will probably give phone numbers that you can contact to make appointments.  From Google translate: M?i th? c v? t?t trong bu?i khm s?c kh?e c?a b?n hm nay.  Chng ti ?ang ti?n hnh xt nghi?m mu ??i v?i b?n v chng ti s? cho b?n bi?t k?t qu? c?a nh?ng xt nghi?m ? trong vi ngy ho?c tu?n t?i ho?c lu h?n.  Chng ti ? ln l?ch cho b?n ch?p X-quang tuy?n v v ai ? s? lin h? v?i b?n v? cu?c h?n ?. N?u b?n khng bi?t v? ?i?u ? trong m?t ho?c 2 tu?n t?i, vui lng  g?i cho v?n phng v h?i xem ?i?u g ? x?y ra v?i vi?c ch?p X-quang tuy?n v ? ???c ln l?ch.  Tim c ?y c v? ?n khi khm v trn ?i?n tm ??. Khng c thay ??i ?ng k? no so v?i EKG kho?ng 8 n?m tr??c.  ? 8 n?m k? t? khi c khm n?i soi, v v?y trong kho?ng 2 n?m n?a ?i?u ? nn ???c l?p l?i.  M?t m?i ??n t? nhi?u l do (m?t m?i). Ti ? ??  ng vin c ?y ti?p t?c v?n ??ng v ti?p t?c c? g?ng ?i b? th?t nhi?u.  C ?y c th? dng Tylenol ho?c Aleve ho?c Advil n?u c?n ??i v?i cc c?n ?au nh?c.  N?u b? ? chua, c ?y c th? dng Prilosec khng k ??n (omeprazole) ho?c pantoprazole (Protonix).  ???c ki?m tra l?i n?u pht sinh thm v?n ??. N?u c?n ?au ng?c ngy cng tr?m tr?ng h?n, chng ti c th? gi?i thi?u c ?y ??n bc s? tim m?ch, nh?ng ti khng tin r?ng nh?ng c?n ?au ng?c l do tim c?a c ?y v khng ngh? l c?n thi?t vo lc ny.  Nh? ti ? th?o lu?n v?i b?n, v?n phng ny s? b? b?nh vi?n [facility] ?ng c?a vo cu?i thng [facility]. B?n s? c?n tm m?t bc s? gia ?nh ho?c bc s? ch?m [facility] chnh khc. Ti gi? ??nh r?ng khi b?nh vi?n g?i th?, h? c th? s? cung c?p s? ?i?n tho?i m b?n c th? lin h? ?? ??t l?ch h?n.   If you have lab work done today you will be contacted with your lab results within the next 2 weeks.  If you have not heard from [place] then please contact us. The fastest way to get your results is to register for My Chart.   IF you received an x-ray today, you will receive an invoice from [facility]. Please contact [facility] at [phone] with questions or concerns regarding your invoice.   IF you received labwork today, you will receive an invoice from [facility]. Please contact [facility] at [phone] with questions or concerns regarding your invoice.   Our billing staff will not be able to assist you with questions regarding bills from these companies.  You will be contacted with the lab results as soon as they are available. The fastest way to get your results  is to activate your My Chart account. Instructions are located on the last page of this paperwork. If you have not heard from [place] regarding the results in 2 weeks, please contact this office.        Return in about 1 year (around 04/30/2021).   Ruben Reason, MD 04/30/2020

## 2020-05-01 ENCOUNTER — Other Ambulatory Visit: Payer: Self-pay | Admitting: Family Medicine

## 2020-05-01 DIAGNOSIS — D696 Thrombocytopenia, unspecified: Secondary | ICD-10-CM

## 2020-05-01 LAB — CBC WITH DIFFERENTIAL/PLATELET
Basophils Absolute: 0 10*3/uL (ref 0.0–0.2)
Basos: 1 %
EOS (ABSOLUTE): 0.3 10*3/uL (ref 0.0–0.4)
Eos: 5 %
Hematocrit: 43.1 % (ref 34.0–46.6)
Hemoglobin: 14.3 g/dL (ref 11.1–15.9)
Immature Grans (Abs): 0 10*3/uL (ref 0.0–0.1)
Immature Granulocytes: 0 %
Lymphocytes Absolute: 2.4 10*3/uL (ref 0.7–3.1)
Lymphs: 45 %
MCH: 29.9 pg (ref 26.6–33.0)
MCHC: 33.2 g/dL (ref 31.5–35.7)
MCV: 90 fL (ref 79–97)
Monocytes Absolute: 0.3 10*3/uL (ref 0.1–0.9)
Monocytes: 7 %
Neutrophils Absolute: 2.2 10*3/uL (ref 1.4–7.0)
Neutrophils: 42 %
Platelets: 99 10*3/uL — CL (ref 150–450)
RBC: 4.79 x10E6/uL (ref 3.77–5.28)
RDW: 12.4 % (ref 11.7–15.4)
WBC: 5.2 10*3/uL (ref 3.4–10.8)

## 2020-05-01 LAB — HEPATITIS C ANTIBODY: Hep C Virus Ab: <0.1 s/co ratio (ref 0.0–0.9)

## 2020-05-01 LAB — COMPREHENSIVE METABOLIC PANEL
ALT: 14 IU/L (ref 0–32)
AST: 20 IU/L (ref 0–40)
Albumin/Globulin Ratio: 1.8 (ref 1.2–2.2)
Albumin: 4.6 g/dL (ref 3.8–4.8)
Alkaline Phosphatase: 51 IU/L (ref 44–121)
BUN/Creatinine Ratio: 27 (ref 12–28)
BUN: 18 mg/dL (ref 8–27)
Bilirubin Total: 0.3 mg/dL (ref 0.0–1.2)
CO2: 24 mmol/L (ref 20–29)
Calcium: 9.7 mg/dL (ref 8.7–10.3)
Chloride: 104 mmol/L (ref 96–106)
Creatinine, Ser: 0.67 mg/dL (ref 0.57–1.00)
GFR calc Af Amer: 108 mL/min/{1.73_m2} (ref >59)
GFR calc non Af Amer: 94 mL/min/{1.73_m2} (ref >59)
Globulin, Total: 2.5 g/dL (ref 1.5–4.5)
Glucose: 92 mg/dL (ref 65–99)
Potassium: 4.1 mmol/L (ref 3.5–5.2)
Sodium: 142 mmol/L (ref 134–144)
Total Protein: 7.1 g/dL (ref 6.0–8.5)

## 2020-05-01 LAB — HEMOGLOBIN A1C
Est. average glucose Bld gHb Est-mCnc: 117 mg/dL
Hgb A1c MFr Bld: 5.7 % — ABNORMAL HIGH (ref 4.8–5.6)

## 2020-05-01 LAB — LIPID PANEL
Chol/HDL Ratio: 3.5 ratio (ref 0.0–4.4)
Cholesterol, Total: 194 mg/dL (ref 100–199)
HDL: 56 mg/dL (ref >39)
LDL Chol Calc (NIH): 118 mg/dL — ABNORMAL HIGH (ref 0–99)
Triglycerides: 113 mg/dL (ref 0–149)
VLDL Cholesterol Cal: 20 mg/dL (ref 5–40)

## 2020-05-01 LAB — HIV ANTIBODY (ROUTINE TESTING W REFLEX): HIV Screen 4th Generation wRfx: NONREACTIVE

## 2020-05-01 LAB — TSH: TSH: 0.802 u[IU]/mL (ref 0.450–4.500)

## 2020-05-01 NOTE — Progress Notes (Signed)
Call patient:  Speak to the daughter as patient doesn't speak English.  Labs are good except for the platelet count.  Platelets are a cell which help the blood to clot.  She has always been low, but now is in the concerning range where and injury could cause her to bleed more than she should.  I would like her to be evaluated by a blood specialist (hematology/oncology are the specialists that care for blood disorders).    I am making a referral.  No other recommendations at this time.  Fenton Malling. Linna Darner MD

## 2020-05-04 ENCOUNTER — Telehealth: Payer: Self-pay | Admitting: Hematology and Oncology

## 2020-05-04 NOTE — Telephone Encounter (Signed)
Received a new hem referral from Beth Brooks for thrombocytopenia. Beth Brooks has been scheduled to see Dr. Lorenso Courier on 2/23 at [place] date and time has been given to the pt's wife. Aware to arrive 20 minutes early.

## 2020-05-19 ENCOUNTER — Inpatient Hospital Stay: Payer: 59

## 2020-05-19 ENCOUNTER — Other Ambulatory Visit: Payer: Self-pay

## 2020-05-19 ENCOUNTER — Encounter: Payer: Self-pay | Admitting: Hematology and Oncology

## 2020-05-19 ENCOUNTER — Inpatient Hospital Stay: Payer: 59 | Attending: Hematology and Oncology | Admitting: Hematology and Oncology

## 2020-05-19 VITALS — BP 126/61 | HR 77 | Temp 97.3°F | Resp 18 | Ht 59.0 in | Wt 133.9 lb

## 2020-05-19 DIAGNOSIS — K219 Gastro-esophageal reflux disease without esophagitis: Secondary | ICD-10-CM | POA: Insufficient documentation

## 2020-05-19 DIAGNOSIS — D696 Thrombocytopenia, unspecified: Secondary | ICD-10-CM

## 2020-05-19 DIAGNOSIS — Z79899 Other long term (current) drug therapy: Secondary | ICD-10-CM | POA: Insufficient documentation

## 2020-05-19 LAB — CMP (CANCER CENTER ONLY)
ALT: 14 U/L (ref 0–44)
AST: 17 U/L (ref 15–41)
Albumin: 4.6 g/dL (ref 3.5–5.0)
Alkaline Phosphatase: 51 U/L (ref 38–126)
Anion gap: 9 (ref 5–15)
BUN: 13 mg/dL (ref 8–23)
CO2: 27 mmol/L (ref 22–32)
Calcium: 9.6 mg/dL (ref 8.9–10.3)
Chloride: 107 mmol/L (ref 98–111)
Creatinine: 0.76 mg/dL (ref 0.44–1.00)
GFR, Estimated: >60 mL/min (ref >60)
Glucose, Bld: 95 mg/dL (ref 70–99)
Potassium: 3.8 mmol/L (ref 3.5–5.1)
Sodium: 143 mmol/L (ref 135–145)
Total Bilirubin: 0.7 mg/dL (ref 0.3–1.2)
Total Protein: 8.5 g/dL — ABNORMAL HIGH (ref 6.5–8.1)

## 2020-05-19 LAB — CBC WITH DIFFERENTIAL (CANCER CENTER ONLY)
Abs Immature Granulocytes: 0.01 10*3/uL (ref 0.00–0.07)
Basophils Absolute: 0 10*3/uL (ref 0.0–0.1)
Basophils Relative: 1 %
Eosinophils Absolute: 0.2 10*3/uL (ref 0.0–0.5)
Eosinophils Relative: 4 %
HCT: 46.8 % — ABNORMAL HIGH (ref 36.0–46.0)
Hemoglobin: 15.2 g/dL — ABNORMAL HIGH (ref 12.0–15.0)
Immature Granulocytes: 0 %
Lymphocytes Relative: 34 %
Lymphs Abs: 2.3 10*3/uL (ref 0.7–4.0)
MCH: 29.2 pg (ref 26.0–34.0)
MCHC: 32.5 g/dL (ref 30.0–36.0)
MCV: 90 fL (ref 80.0–100.0)
Monocytes Absolute: 0.3 10*3/uL (ref 0.1–1.0)
Monocytes Relative: 5 %
Neutro Abs: 3.9 10*3/uL (ref 1.7–7.7)
Neutrophils Relative %: 56 %
Platelet Count: 132 10*3/uL — ABNORMAL LOW (ref 150–400)
RBC: 5.2 MIL/uL — ABNORMAL HIGH (ref 3.87–5.11)
RDW: 12.7 % (ref 11.5–15.5)
WBC Count: 6.8 10*3/uL (ref 4.0–10.5)
nRBC: 0 % (ref 0.0–0.2)

## 2020-05-19 LAB — VITAMIN B12: Vitamin B-12: 769 pg/mL (ref 180–914)

## 2020-05-19 LAB — PLATELET BY CITRATE

## 2020-05-19 LAB — IMMATURE PLATELET FRACTION: Immature Platelet Fraction: 12.4 % — ABNORMAL HIGH (ref 1.2–8.6)

## 2020-05-19 LAB — FOLATE: Folate: 44.1 ng/mL (ref >5.9)

## 2020-05-19 LAB — SAVE SMEAR(SSMR), FOR PROVIDER SLIDE REVIEW

## 2020-05-19 LAB — LACTATE DEHYDROGENASE: LDH: 192 U/L (ref 98–192)

## 2020-05-19 NOTE — Progress Notes (Signed)
[place] [phone]   [phone] [place] NOTE  Patient Care Team: Ladell Pier, MD as PCP - General (Internal Medicine) Maximiano Coss, NP as PCP - Family Medicine (Adult Health Nurse Practitioner)  Hematological/Oncological History # Thrombocytopenia 01/08/2017: WBC 5.0, Hgb 15.1, Plt 127, MCV 89 12/23/2018: WBC 5.8, Hgb 15.3, Plt 106, MCV 86 04/30/2020: WBC 5.2, Hgb 14.3, MCV 90, Plt 99 05/19/2020: establish care with Dr. Lorenso Courier   CHIEF COMPLAINTS/PURPOSE OF CONSULTATION:  "Thrombocytopenia "  HISTORY OF PRESENTING ILLNESS:  Beth Brooks 65 y.o. female with medical history significant for GERD who presents for evaluation of thrombocytopenia.  On review of the previous records Beth Brooks history of thrombocytopenia dating back to at least 01/08/2017 at which time the platelet count was 127.  On 12/23/2018 the patient was found to have a platelet count of 106.  Most recently on 04/30/2020 patient had a platelet count of 99.  Throughout that time the patient has a normal white blood cell count, normal MCV, and a normal hemoglobin.  Due to concern for these downward trending platelet count the patient was referred to hematology for further evaluation and management.  Of note the CBCs reported that there was some clumping of the platelets which may result in an inaccurate counting.  On exam today Beth Brooks's history taking is assisted by a hospital approved interpreter service.  She reports that she was recently seen by her primary care provider for chest pain and fatigue.  An EKG was performed and there was no evidence of heart trouble, however it was noted that she had "a low blood count".  She was referred to a specialist for further evaluation.  On discussion today she notes that she has not been having any major issues with bleeding, bruising, or dark stools.  She does occasionally have some bright red blood in her stool which is  associated with constipation and thought to be secondary to internal hemorrhoids.  She notes that there are particular foods that they cause her constipation and can provoke this.  She denies any high volume blood loss, lightheadedness, dizziness, or or shortness of breath.  On further discussion she reports no family history of any blood disorders.  She is a never smoker, never drinker, and does not drink coffee.  She only drinks water.  She reports that other than the chest pain and the fatigue she has not had any new symptoms lately.  A focused ROS is listed below.  MEDICAL HISTORY:  Past Medical History:  Diagnosis Date  . Gastritis   . GERD (gastroesophageal reflux disease)   . Overweight 01/11/2017    SURGICAL HISTORY: Past Surgical History:  Procedure Laterality Date  . TUBAL LIGATION  1994    SOCIAL HISTORY: Social History   Socioeconomic History  . Marital status: Widowed    Spouse name: Eddie Dibbles Adup  . Number of children: 8  . Years of education: 8  . Highest education level: Not on file  Occupational History  . Occupation: Housewife  Tobacco Use  . Smoking status: Never Smoker  . Smokeless tobacco: Never Used  Vaping Use  . Vaping Use: Never used  Substance and Sexual Activity  . Alcohol use: No  . Drug use: No  . Sexual activity: Not Currently    Comment: married  Other Topics Concern  . Not on file  Social History Narrative   Originally from [place]   [place]   Came to [place]. In 1999  Lives with her husband and one daughter.   Husband with hepatic carcinoma, for which he has been treated past 3 years.     She has support to give her time off.   Son who interprets takes her husband for chemo twice weekly.   Social Determinants of Health   Financial Resource Strain: Not on file  Food Insecurity: Not on file  Transportation Needs: Not on file  Physical Activity: Not on file  Stress: Not on file  Social Connections: Not on file  Intimate Partner Violence:  Not on file    FAMILY HISTORY: Family History  Problem Relation Age of Onset  . Gestational diabetes Daughter   . Alcohol abuse Brother   . Cerebrovascular Accident Brother        "blood clot on brain"  [employer] now.    ALLERGIES:  has No Known Allergies.  MEDICATIONS:  Current Outpatient Medications  Medication Sig Dispense Refill  . acetaminophen (TYLENOL) 325 MG tablet Take 650 mg by mouth every 6 (six) hours as needed.     No current facility-administered medications for this visit.    REVIEW OF SYSTEMS:   Constitutional: ( - ) fevers, ( - )  chills , ( - ) night sweats Eyes: ( - ) blurriness of vision, ( - ) double vision, ( - ) watery eyes Ears, nose, mouth, throat, and face: ( - ) mucositis, ( - ) sore throat Respiratory: ( - ) cough, ( - ) dyspnea, ( - ) wheezes Cardiovascular: ( - ) palpitation, ( - ) chest discomfort, ( - ) lower extremity swelling Gastrointestinal:  ( - ) nausea, ( - ) heartburn, ( - ) change in bowel habits Skin: ( - ) abnormal skin rashes Lymphatics: ( - ) new lymphadenopathy, ( - ) easy bruising Neurological: ( - ) numbness, ( - ) tingling, ( - ) new weaknesses Behavioral/Psych: ( - ) mood change, ( - ) new changes  All other systems were reviewed with the patient and are negative.  PHYSICAL EXAMINATION:  Vitals:   05/19/20 0850  BP: 126/61  Pulse: 77  Resp: 18  Temp: (!) 97.3 F (36.3 C)  SpO2: 100%   Filed Weights   05/19/20 0850  Weight: 133 lb 14.4 oz (60.7 kg)    GENERAL: well appearing middle aged [place] female in NAD  SKIN: skin color, texture, turgor are normal, no rashes or significant lesions EYES: conjunctiva are pink and non-injected, sclera clear LUNGS: clear to auscultation and percussion with normal breathing effort HEART: regular rate & rhythm and no murmurs and no lower extremity edema Musculoskeletal: no cyanosis of digits and no clubbing  PSYCH: alert & oriented x 3, fluent speech NEURO: no focal motor/sensory  deficits  LABORATORY DATA:  I have reviewed the data as listed CBC Latest Ref Rng & Units 04/30/2020 12/23/2018 01/08/2017  WBC 3.4 - 10.8 x10E3/uL 5.2 5.8 5.0  Hemoglobin 11.1 - 15.9 g/dL 14.3 15.3 15.1  Hematocrit 34.0 - 46.6 % 43.1 47.7(H) 45.4  Platelets 150 - 450 x10E3/uL 99(LL) 106(L) 127(L)    CMP Latest Ref Rng & Units 04/30/2020 12/23/2018 01/08/2017  Glucose 65 - 99 mg/dL 92 92 93  BUN 8 - 27 mg/dL 18 14 24   Creatinine 0.57 - 1.00 mg/dL 0.67 0.63 0.75  Sodium 134 - 144 mmol/L 142 142 148(H)  Potassium 3.5 - 5.2 mmol/L 4.1 4.8 4.7  Chloride 96 - 106 mmol/L 104 102 106  CO2 20 - 29 mmol/L 24 28  23  Calcium 8.7 - 10.3 mg/dL 9.7 9.8 9.3  Total Protein 6.0 - 8.5 g/dL 7.1 7.4 7.3  Total Bilirubin 0.0 - 1.2 mg/dL 0.3 0.6 0.4  Alkaline Phos 44 - 121 IU/L 51 84 48  AST 0 - 40 IU/L 20 20 15   ALT 0 - 32 IU/L 14 19 9    BLOOD FILM: Pending  RADIOGRAPHIC STUDIES: No results found.  ASSESSMENT & PLAN Beth Brooks 65 y.o. female with medical history significant for GERD who presents for evaluation of thrombocytopenia.   After review the labs, the records, schedule the patient the findings most consistent with a thrombocytopenia secondary to pseudothrombocytopenia.  In order to rule this out we will quickly assess a CBC within 15 minutes of being drawn and collect a citrated tube.  Other possible etiologies could include liver disease or nutritional deficiency.  The patient has been screened for hep C and HIV earlier this month and been found to be negative.  If no clear etiology can be discerned from above studies we would need to consider abdominal ultrasound to look for splenomegaly or liver disease and consider hepatitis B testing.  Overall given the lab reporting clumping on prior samples I do believe that this is not a true thrombocytopenia.  We will confirm this with the above studies.  If that is found to be the case the patient does not require routine follow-up in our  clinic.  #Thrombocytopenia, Mild, chronic.  --findings at this time are concerning for pseudothrombocytopenia, with inaccurate platelet counts due to clumping. Will order platelets in citrate to try and avoid this issue --additionally will order CMP and CBC --will review a peripheral blood film to assess for clumping --negative screens for Hep C and HIV on 04/30/2020 --nutritional assessment with Vitamin b12 and folate -- if no clear etiology can be discerned from the above studies consider abodminal [place] (for liver disease/splenomegaly) and Hep B testing.  --RTC pending the results of the above studies  Orders Placed This Encounter  Procedures  . CBC with Differential (Cancer Center Only)    Standing Status:   Future    Standing Expiration Date:   05/19/2021  . Immature Platelet Fraction    Standing Status:   Future    Standing Expiration Date:   05/19/2021  . Platelet by Citrate    Standing Status:   Future    Standing Expiration Date:   05/19/2021  . Save Smear (SSMR)    Standing Status:   Future    Standing Expiration Date:   05/19/2021  . CMP ([place] only)    Standing Status:   Future    Standing Expiration Date:   05/19/2021  . Lactate dehydrogenase (LDH)    Standing Status:   Future    Standing Expiration Date:   05/19/2021  . Vitamin B12    Standing Status:   Future    Standing Expiration Date:   05/19/2021  . Folate, Serum    Standing Status:   Future    Standing Expiration Date:   05/19/2021    All questions were answered. The patient knows to call the clinic with any problems, questions or concerns.  A total of more than 60 minutes were spent on this encounter and over half of that time was spent on counseling and coordination of care as outlined above.   Ledell Peoples, MD Department of Hematology/Oncology [place] [facility] Phone: [phone] Pager: [phone] Email: [email or web address]  05/19/2020  9:51 AM

## 2020-07-07 ENCOUNTER — Telehealth: Payer: Self-pay | Admitting: Hematology and Oncology

## 2020-07-07 NOTE — Telephone Encounter (Signed)
Scheduled appt per 4/6 sch msg. Mailed updated calendar to pt.

## 2020-07-20 ENCOUNTER — Ambulatory Visit (INDEPENDENT_AMBULATORY_CARE_PROVIDER_SITE_OTHER): Payer: 59

## 2020-07-20 ENCOUNTER — Encounter: Payer: Self-pay | Admitting: Family Medicine

## 2020-07-20 ENCOUNTER — Ambulatory Visit (INDEPENDENT_AMBULATORY_CARE_PROVIDER_SITE_OTHER): Payer: 59 | Admitting: Family Medicine

## 2020-07-20 ENCOUNTER — Encounter: Payer: Self-pay | Admitting: Nurse Practitioner

## 2020-07-20 ENCOUNTER — Other Ambulatory Visit: Payer: Self-pay

## 2020-07-20 DIAGNOSIS — K625 Hemorrhage of anus and rectum: Secondary | ICD-10-CM | POA: Insufficient documentation

## 2020-07-20 DIAGNOSIS — Z23 Encounter for immunization: Secondary | ICD-10-CM

## 2020-07-20 DIAGNOSIS — D696 Thrombocytopenia, unspecified: Secondary | ICD-10-CM

## 2020-07-20 LAB — POCT HEMOGLOBIN: Hemoglobin: 13.9 g/dL (ref 11–14.6)

## 2020-07-20 NOTE — Assessment & Plan Note (Addendum)
Broad differential for rectal bleeding including malignancy, hemorrhoids, diverticulitis, IBD, AVM etc. Last colonoscopy was in 2013 which showed 01/31/2012 screen: No polyps. Internal hemorrhoids. EGD with findings of mild gastritis and negative dysplasia or H. Pylori. No family history of colon cancer. Rectal bleeding likely worsened by thrombocytopenia. Last platelets 132 on 05/26/20. Platelets today 97 (with clumping). On exam pt has LLQ tenderness, no guarding. Normal rectal exam. Orthostatic vitals wnl. Pt is hemodynamically stable. Obtained POCT Hb 13.9 today which is 2 U drop from last CBC 2 months ago where Hb was 15.2. Hb on CBC 14 which is reassuring. Referred to GI urgently. Strict safety precautions provided to daughter and patient who expressed understanding. Follow up with me in 2 weeks.

## 2020-07-20 NOTE — Progress Notes (Signed)
Subjective:    Patient ID: Beth Brooks, female    DOB: 08-14-1955, 65 y.o.   MRN: [id number]   CC: New Patient  HPI:  Rectal bleeding  Pt has a history of hemorrhoids. It started on 2018. She has had severe rectal bleeding for the last 2 months. She gets the bleeding every 2-3 days. Unsure quantity. Color-bright red and mucus. Pt Worsened by eating spicy food. She has left lower quadrant pain. She feels dizzy with the blood loss. Last time she had rectal bleeding was yesterday. Denies taking anticoagulant. She has lost weight- 5lb which is intentional.   PMH: Past Medical History:  Diagnosis Date  . Gastritis   . GERD (gastroesophageal reflux disease)   . Overweight 01/11/2017  Thrombocytopenia    PSH: Past Surgical History:  Procedure Laterality Date  . TUBAL LIGATION  1994    DH: Tylenol PRN Vitamin D   Allergies-NKDA   FH: Family History  Problem Relation Age of Onset  . Gestational diabetes Daughter   . Alcohol abuse Brother   . Cerebrovascular Accident Brother        "blood clot on brain"  [place] now.     SH:   Who lives at home: daughter 07/21/2020 ADLs: yes  07/21/2020  Work / Education:  No  07/21/2020 Smoker: no 07/21/2020  EOTH: no  07/08/2438  Illicit drug use: no  03/29/7251  Current Stressors: none  07/21/2020  Preventative Screening Colonoscopy: 2013  Mammogram: never done  Pap test: 2018  DEXA: never done  Infuenza vaccine: 2021 COVID vaccine: x 2, booster pending  Tetanus vaccine:  2020 Pneumonia vaccine: 2021 Shingles vaccine: not done   Smoking status reviewed  Review of Systems   Objective:  BP 123/63   Pulse 81   Ht 4\' 11"  (1.499 m)   Wt 129 lb 3.2 oz (58.6 kg)   LMP 01/12/2012   SpO2 98%   BMI 26.10 kg/m    Orthostatic BP   Vitals and nursing note reviewed  General: Alert, no acute distress Cardio: Normal S1 and S2, RRR, no r/m/g Pulm: CTAB, normal work of breathing Abdomen: Bowel sounds normal. Abdomen soft and  non-tender.  Extremities: No peripheral edema.  Neuro: Cranial nerves grossly intact   Rectal exam chaperoned by CMA Alexis. Tolerated well without pain. Unable to assess anal tone due to language barrier. No masses felt in rectal. No blood/melena on glove. 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 Followed by Hematology. Likely nutritional/ liver dysfunction. Next follow up is on 9/7 with Dr Lorenso Courier. Platelets 97 today, dropped from 132 2 months ago. Recommend that pt calls for a sooner appointment due to recent rectal bleeding.  Rectal bleeding Broad differential for rectal bleeding including malignancy, hemorrhoids, diverticulitis, IBD, AVM etc. Last colonoscopy was in 2013 which showed 01/31/2012 screen: No polyps. Internal hemorrhoids. EGD with findings of mild gastritis and negative dysplasia or H. Pylori. No family history of colon cancer. Rectal bleeding likely worsened by thrombocytopenia. Last platelets 132 on 05/26/20. Platelets today 97 (with clumping). On exam pt has LLQ tenderness, no guarding. Normal rectal exam. Orthostatic vitals wnl. Pt is hemodynamically stable. Obtained POCT Hb 13.9 today which is 2 U drop from last CBC 2 months ago where Hb was 15.2. Hb on CBC 14 which is reassuring. Referred to GI urgently. Strict safety precautions provided to daughter and patient who expressed understanding. Follow up with me in 2  weeks.     No follow-ups on file.   Lattie Haw MD, PGY-2

## 2020-07-20 NOTE — Patient Instructions (Addendum)
Thank you for coming to see me today. It was a pleasure. Today we discussed your rectal bleeding. I am not sure what the cause is. It could be hemorrhoids or due to something else such as cancer, diverticulitis etc. Your platelets are low which are making the bleeding worse.  I have referred you to gastroenterology. You will need a colonscopy ASAP.   Please contact your Hematologist as soon as possible for an appointment.   We will get some labs today.  If they are abnormal or we need to do something about them, I will call you.  If they are normal, I will send you a message on MyChart (if it is active) or a letter in the mail.  If you don't hear from [place] in 2 weeks, please call the office at the number below.  Please follow-up with me in 2 weeks.   If you have any questions or concerns, please do not hesitate to call the office at [phone].  Best wishes,   Dr Posey Pronto

## 2020-07-20 NOTE — Assessment & Plan Note (Addendum)
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 Followed by Hematology. Likely nutritional/ liver dysfunction. Next follow up is on 9/7 with Dr Lorenso Courier. Platelets 97 today, dropped from 132 2 months ago. Recommend that pt calls for a sooner appointment due to recent rectal bleeding.

## 2020-07-21 ENCOUNTER — Telehealth: Payer: Self-pay | Admitting: *Deleted

## 2020-07-21 ENCOUNTER — Telehealth: Payer: Self-pay | Admitting: Family Medicine

## 2020-07-21 ENCOUNTER — Telehealth: Payer: Self-pay | Admitting: Hematology and Oncology

## 2020-07-21 LAB — CBC
Hematocrit: 43.8 % (ref 34.0–46.6)
Hemoglobin: 14.2 g/dL (ref 11.1–15.9)
MCH: 29.6 pg (ref 26.6–33.0)
MCHC: 32.4 g/dL (ref 31.5–35.7)
MCV: 91 fL (ref 79–97)
Platelets: 97 10*3/uL — CL (ref 150–450)
RBC: 4.8 x10E6/uL (ref 3.77–5.28)
RDW: 12 % (ref 11.7–15.4)
WBC: 7.4 10*3/uL (ref 3.4–10.8)

## 2020-07-21 NOTE — Telephone Encounter (Signed)
Received call from Dr. Posey Pronto who woulld like Dr. Lorenso Courier to see patient sooner than September 2022 follow up appt. Pt's platelet count was 97 and she is having some rectal bleeding.  Scheduling message sent for earlier appt

## 2020-07-21 NOTE — Telephone Encounter (Signed)
Scheduled appt per 4/27 sch msg. Called pt, no answer. Left msg with appt date and time.

## 2020-07-21 NOTE — Telephone Encounter (Signed)
Called oncology centre to speak to Dr Dorsey-pt's hematologist. His out of the office, however I did speak to is Therapist, sports. She explained that her platelets are stable and anything <50 is concerning. She will get pt seen ASAP in their clinic, forward message to Dr Lorenso Courier and call me with his advice.

## 2020-07-22 ENCOUNTER — Telehealth: Payer: Self-pay | Admitting: Hematology and Oncology

## 2020-07-22 NOTE — Telephone Encounter (Signed)
Scheduled appt per 4/27 sch msg. Pt's daughter is aware.

## 2020-07-30 ENCOUNTER — Encounter: Payer: Self-pay | Admitting: Hematology and Oncology

## 2020-07-30 ENCOUNTER — Inpatient Hospital Stay: Payer: 59

## 2020-07-30 ENCOUNTER — Inpatient Hospital Stay: Payer: 59 | Attending: Hematology and Oncology | Admitting: Hematology and Oncology

## 2020-07-30 ENCOUNTER — Other Ambulatory Visit: Payer: Self-pay | Admitting: Hematology and Oncology

## 2020-07-30 ENCOUNTER — Other Ambulatory Visit: Payer: Self-pay

## 2020-07-30 VITALS — BP 125/69 | HR 74 | Temp 97.5°F | Resp 18 | Ht 59.0 in | Wt 130.4 lb

## 2020-07-30 DIAGNOSIS — D696 Thrombocytopenia, unspecified: Secondary | ICD-10-CM

## 2020-07-30 DIAGNOSIS — K921 Melena: Secondary | ICD-10-CM | POA: Insufficient documentation

## 2020-07-30 DIAGNOSIS — K649 Unspecified hemorrhoids: Secondary | ICD-10-CM

## 2020-07-30 DIAGNOSIS — K219 Gastro-esophageal reflux disease without esophagitis: Secondary | ICD-10-CM | POA: Insufficient documentation

## 2020-07-30 DIAGNOSIS — M129 Arthropathy, unspecified: Secondary | ICD-10-CM | POA: Diagnosis not present

## 2020-07-30 LAB — CBC WITH DIFFERENTIAL (CANCER CENTER ONLY)
Abs Immature Granulocytes: 0.01 10*3/uL (ref 0.00–0.07)
Basophils Absolute: 0 10*3/uL (ref 0.0–0.1)
Basophils Relative: 0 %
Eosinophils Absolute: 0.3 10*3/uL (ref 0.0–0.5)
Eosinophils Relative: 4 %
HCT: 42.4 % (ref 36.0–46.0)
Hemoglobin: 13.9 g/dL (ref 12.0–15.0)
Immature Granulocytes: 0 %
Lymphocytes Relative: 47 %
Lymphs Abs: 2.8 10*3/uL (ref 0.7–4.0)
MCH: 29.7 pg (ref 26.0–34.0)
MCHC: 32.8 g/dL (ref 30.0–36.0)
MCV: 90.6 fL (ref 80.0–100.0)
Monocytes Absolute: 0.4 10*3/uL (ref 0.1–1.0)
Monocytes Relative: 6 %
Neutro Abs: 2.6 10*3/uL (ref 1.7–7.7)
Neutrophils Relative %: 43 %
Platelet Count: 164 10*3/uL (ref 150–400)
RBC: 4.68 MIL/uL (ref 3.87–5.11)
RDW: 12.3 % (ref 11.5–15.5)
WBC Count: 6 10*3/uL (ref 4.0–10.5)
nRBC: 0 % (ref 0.0–0.2)

## 2020-07-30 LAB — CMP (CANCER CENTER ONLY)
ALT: 13 U/L (ref 0–44)
AST: 18 U/L (ref 15–41)
Albumin: 4.1 g/dL (ref 3.5–5.0)
Alkaline Phosphatase: 55 U/L (ref 38–126)
Anion gap: 8 (ref 5–15)
BUN: 15 mg/dL (ref 8–23)
CO2: 27 mmol/L (ref 22–32)
Calcium: 9.1 mg/dL (ref 8.9–10.3)
Chloride: 106 mmol/L (ref 98–111)
Creatinine: 0.73 mg/dL (ref 0.44–1.00)
GFR, Estimated: >60 mL/min (ref >60)
Glucose, Bld: 80 mg/dL (ref 70–99)
Potassium: 4 mmol/L (ref 3.5–5.1)
Sodium: 141 mmol/L (ref 135–145)
Total Bilirubin: 0.3 mg/dL (ref 0.3–1.2)
Total Protein: 7.9 g/dL (ref 6.5–8.1)

## 2020-07-30 LAB — IMMATURE PLATELET FRACTION: Immature Platelet Fraction: 9.2 % — ABNORMAL HIGH (ref 1.2–8.6)

## 2020-07-30 LAB — SAVE SMEAR(SSMR), FOR PROVIDER SLIDE REVIEW

## 2020-07-30 LAB — HEPATITIS B SURFACE ANTIGEN: Hepatitis B Surface Ag: NONREACTIVE

## 2020-07-30 LAB — HEPATITIS B CORE ANTIBODY, TOTAL: Hep B Core Total Ab: NONREACTIVE

## 2020-07-30 LAB — PLATELET BY CITRATE

## 2020-07-30 NOTE — Progress Notes (Signed)
[place] [phone]   [phone]  PROGRESS NOTE  Patient Care Team: Lattie Haw, MD as PCP - General (Family Medicine) Maximiano Coss, NP as PCP - Family Medicine (Adult Health Nurse Practitioner)  Hematological/Oncological History # Thrombocytopenia 01/08/2017: WBC 5.0, Hgb 15.1, Plt 127, MCV 89 12/23/2018: WBC 5.8, Hgb 15.3, Plt 106, MCV 86 04/30/2020: WBC 5.2, Hgb 14.3, MCV 90, Plt 99 05/19/2020: establish care with Dr. Lorenso Courier  07/30/2020: WBC 6.0, Hgb 13.9, MCV 90.6, Plt 164  Interval History:  [place] 65 y.o. female with medical history significant for thrombocytopenia of unclear etiology who presents for a follow up visit. The patient's last visit was on 05/19/2020. In the interim since the last visit she has developed worsening bleeding of her hemorrhoids.  On exam today [place] is accompanied by her daughter.  She reports that since last time she was seen she has been developing bright red blood in her stool.  She notes that there is much straining and pain with her bowel movements.  She does there is predominantly bright red blood in the toilet and some in the toilet paper as well.  She reports that this tends to be worse with spicy food.  She last had a colonoscopy 8 years ago and fortunately has an appointment with GI next week.  She does report some occasional lightheadedness and dizziness as well as rare shortness of breath with his blood loss.  She otherwise denies any fevers, chills, sweats, nausea, vomiting or diarrhea.  A full 10 point ROS is listed below.  MEDICAL HISTORY:  Past Medical History:  Diagnosis Date  . Gastritis   . GERD (gastroesophageal reflux disease)   . Overweight 01/11/2017    SURGICAL HISTORY: Past Surgical History:  Procedure Laterality Date  . TUBAL LIGATION  1994    SOCIAL HISTORY: Social History   Socioeconomic History  . Marital status: Widowed    Spouse name: Eddie Dibbles Adup  . Number of  children: 8  . Years of education: 8  . Highest education level: Not on file  Occupational History  . Occupation: Housewife  Tobacco Use  . Smoking status: Never Smoker  . Smokeless tobacco: Never Used  Vaping Use  . Vaping Use: Never used  Substance and Sexual Activity  . Alcohol use: No  . Drug use: No  . Sexual activity: Not Currently    Comment: married  Other Topics Concern  . Not on file  Social History Narrative   Originally from [place]   [place]   Came to [place] In [place] with her husband and one daughter.   Husband with hepatic carcinoma, for which he has been treated past 3 years.     She has support to give her time off.   Son who interprets takes her husband for chemo twice weekly.   Social Determinants of Health   Financial Resource Strain: Not on file  Food Insecurity: Not on file  Transportation Needs: Not on file  Physical Activity: Not on file  Stress: Not on file  Social Connections: Not on file  Intimate Partner Violence: Not on file    FAMILY HISTORY: Family History  Problem Relation Age of Onset  . Gestational diabetes Daughter   . Alcohol abuse Brother   . Cerebrovascular Accident Brother        "blood clot on brain"  [place] now.    ALLERGIES:  has No Known Allergies.  MEDICATIONS:  Current Outpatient Medications  Medication Sig Dispense Refill  . acetaminophen (TYLENOL) 325 MG tablet Take 650 mg by mouth every 6 (six) hours as needed.     No current facility-administered medications for this visit.    REVIEW OF SYSTEMS:   Constitutional: ( - ) fevers, ( - )  chills , ( - ) night sweats Eyes: ( - ) blurriness of vision, ( - ) double vision, ( - ) watery eyes Ears, nose, mouth, throat, and face: ( - ) mucositis, ( - ) sore throat Respiratory: ( - ) cough, ( - ) dyspnea, ( - ) wheezes Cardiovascular: ( - ) palpitation, ( - ) chest discomfort, ( - ) lower extremity swelling Gastrointestinal:  ( - ) nausea, ( - ) heartburn, ( - ) change  in bowel habits Skin: ( - ) abnormal skin rashes Lymphatics: ( - ) new lymphadenopathy, ( - ) easy bruising Neurological: ( - ) numbness, ( - ) tingling, ( - ) new weaknesses Behavioral/Psych: ( - ) mood change, ( - ) new changes  All other systems were reviewed with the patient and are negative.  PHYSICAL EXAMINATION:  Vitals:   07/30/20 1248  BP: 125/69  Pulse: 74  Resp: 18  Temp: (!) 97.5 F (36.4 C)  SpO2: 99%   Filed Weights   07/30/20 1248  Weight: 130 lb 6.4 oz (59.1 kg)    GENERAL: well appearing middle aged [place] female in NAD. alert, no distress and comfortable SKIN: skin color, texture, turgor are normal, no rashes or significant lesions EYES: conjunctiva are pink and non-injected, sclera clear LUNGS: clear to auscultation and percussion with normal breathing effort HEART: regular rate & rhythm and no murmurs and no lower extremity edema Musculoskeletal: no cyanosis of digits and no clubbing  PSYCH: alert & oriented x 3, fluent speech NEURO: no focal motor/sensory deficits  LABORATORY DATA:  I have reviewed the data as listed CBC Latest Ref Rng & Units 07/30/2020 07/20/2020 07/20/2020  WBC 4.0 - 10.5 K/uL 6.0 7.4 -  Hemoglobin 12.0 - 15.0 g/dL 13.9 13.9 14.2  Hematocrit 36.0 - 46.0 % 42.4 43.8 -  Platelets 150 - 400 K/uL 164 97(LL) -    CMP Latest Ref Rng & Units 07/30/2020 05/19/2020 04/30/2020  Glucose 70 - 99 mg/dL 80 95 92  BUN 8 - 23 mg/dL 15 13 18   Creatinine 0.44 - 1.00 mg/dL 0.73 0.76 0.67  Sodium 135 - 145 mmol/L 141 143 142  Potassium 3.5 - 5.1 mmol/L 4.0 3.8 4.1  Chloride 98 - 111 mmol/L 106 107 104  CO2 22 - 32 mmol/L 27 27 24   Calcium 8.9 - 10.3 mg/dL 9.1 9.6 9.7  Total Protein 6.5 - 8.1 g/dL 7.9 8.5(H) 7.1  Total Bilirubin 0.3 - 1.2 mg/dL 0.3 0.7 0.3  Alkaline Phos 38 - 126 U/L 55 51 51  AST 15 - 41 U/L 18 17 20   ALT 0 - 44 U/L 13 14 14     RADIOGRAPHIC STUDIES: No results found.  ASSESSMENT & PLAN [name] 65 y.o. female with  medical history significant for thrombocytopenia of unclear etiology who presents for a follow up visit.  After review the labs, the records, schedule the patient the findings most consistent with a thrombocytopenia of unclear etiology.  Possible etiologies could include liver disease or nutritional deficiency.  The patient has been screened for hep C and HIV earlier this month and been found to be negative.  If no clear etiology can be discerned from above  studies we would need to consider abdominal ultrasound to look for splenomegaly or liver disease and hepatitis B testing.    Additional work-up for thrombocytopenia to include SPEP, and repeat evaluation of the peripheral blood film.  #Thrombocytopenia, Mild, chronic.  --findings at this time are concerning for thrombocytopenia of unclear etiology. No correction in citrate with no clear etiology on prior nutritional labs.  --additionally will order CMP and CBC today --will again review  peripheral blood film to assess for clumping --negative screens for Hep C and HIV on 04/30/2020. Hepatitis B testing today. --nutritional assessment with Vitamin b12 and folate negative --MM eval with SPEP and SFLC --interestingly platelets return to normal on labs today.  -- if no clear etiology can be discerned from the above studies consider abodminal [place] (for liver disease/splenomegaly) --RTC in 6 months or sooner if indicated by the above studies   Orders Placed This Encounter  Procedures  . Iron and TIBC    Standing Status:   Future    Standing Expiration Date:   07/30/2021  . Ferritin    Standing Status:   Future    Standing Expiration Date:   07/30/2021    All questions were answered. The patient knows to call the clinic with any problems, questions or concerns.  A total of more than 30 minutes were spent on this encounter and over half of that time was spent on counseling and coordination of care as outlined above.   Ledell Peoples, MD Department of  Hematology/Oncology [place] at [facility] Phone: [phone] Pager: [phone] Email: [email or web address]  07/30/2020 4:35 PM

## 2020-08-02 ENCOUNTER — Telehealth: Payer: Self-pay | Admitting: Hematology and Oncology

## 2020-08-02 LAB — PROTEIN ELECTROPHORESIS, SERUM, WITH REFLEX
A/G Ratio: 1.1 (ref 0.7–1.7)
Albumin ELP: 4.1 g/dL (ref 2.9–4.4)
Alpha-1-Globulin: 0.2 g/dL (ref 0.0–0.4)
Alpha-2-Globulin: 0.6 g/dL (ref 0.4–1.0)
Beta Globulin: 1.2 g/dL (ref 0.7–1.3)
Gamma Globulin: 1.6 g/dL (ref 0.4–1.8)
Globulin, Total: 3.7 g/dL (ref 2.2–3.9)
Total Protein ELP: 7.8 g/dL (ref 6.0–8.5)

## 2020-08-02 LAB — KAPPA/LAMBDA LIGHT CHAINS
Kappa free light chain: 19.6 mg/L — ABNORMAL HIGH (ref 3.3–19.4)
Kappa, lambda light chain ratio: 0.99 (ref 0.26–1.65)
Lambda free light chains: 19.7 mg/L (ref 5.7–26.3)

## 2020-08-02 LAB — IRON AND TIBC
Iron: 98 ug/dL (ref 41–142)
Saturation Ratios: 31 % (ref 21–57)
TIBC: 315 ug/dL (ref 236–444)
UIBC: 217 ug/dL (ref 120–384)

## 2020-08-02 LAB — FERRITIN: Ferritin: 105 ng/mL (ref 11–307)

## 2020-08-02 NOTE — Telephone Encounter (Signed)
Scheduled per los. Called and left msg. Mailed printout  °

## 2020-08-03 ENCOUNTER — Encounter: Payer: Self-pay | Admitting: Family Medicine

## 2020-08-03 ENCOUNTER — Telehealth: Payer: Self-pay | Admitting: *Deleted

## 2020-08-03 ENCOUNTER — Ambulatory Visit (INDEPENDENT_AMBULATORY_CARE_PROVIDER_SITE_OTHER): Payer: 59 | Admitting: Family Medicine

## 2020-08-03 ENCOUNTER — Ambulatory Visit: Payer: 59 | Admitting: Family Medicine

## 2020-08-03 ENCOUNTER — Other Ambulatory Visit: Payer: Self-pay

## 2020-08-03 VITALS — BP 104/68 | HR 62 | Ht 59.0 in | Wt 129.0 lb

## 2020-08-03 DIAGNOSIS — D696 Thrombocytopenia, unspecified: Secondary | ICD-10-CM

## 2020-08-03 DIAGNOSIS — K625 Hemorrhage of anus and rectum: Secondary | ICD-10-CM

## 2020-08-03 NOTE — Telephone Encounter (Signed)
-----   Message from Orson Slick, MD sent at 08/02/2020  3:55 PM EDT ----- POD 7 Nurse  Patient required [place] interpreter. Please let Mrs. Daigneault know that her labs have returned. Her Plts have corrected and are now in the normal range. Her Hgb is fortunately normal despite her hemorrhoidal bleeding. Encourage her to follow with GI as scheduled. for evaluation of the hemorrhoids.    ----- Message ----- From: Buel Ream, Lab In [place] Sent: 07/30/2020   2:08 PM EDT To: Orson Slick, MD

## 2020-08-03 NOTE — Telephone Encounter (Signed)
Attempted to call patient using the [place] interpreter line with [facility].  They attempted twice and on second try they left detailed message on her voicemail.

## 2020-08-03 NOTE — Progress Notes (Signed)
    SUBJECTIVE:   CHIEF COMPLAINT / HPI:   Rectal bleed  Thrombocytopenia follow up: patient seen 4/27 with concern for rectal bleeding.  Patient does have a history of hemorrhoids.  Her last colonoscopy in 2013 showed internal hemorrhoids but no polyps.  At her last appointment referral was made to GI.  Also at this appointment she was noted to have low platelets at 97K, however had improved on subsequent CBC of 167.  Today patient reports she has not had severe rectal bleed in 4 days.  She describes that she usually has rectal bleeding with painful bowel movements, but does not have a lot of bleeding.  She describes the bleeding as bright red blood that is "fresh".  She says that when she eats veggies and other fiber rich foods she does not have hard stools or painful bowel movements.  When she eats hard/drier foods such as limiting grass and various herbs/spices she tends to be more constipated which causes her discomfort and rectal bleeding with stooling.  Today she denies any nausea, vomiting, diarrhea, and abdominal pain.  She denies shortness of breath, dizziness, and chest pain.   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 Followed by Hematology. Likely nutritional/ liver dysfunction. Next follow up is on 9/7 with Dr Lorenso Courier. Platelets 97 today, dropped from 132 2 months ago. Recommend that pt calls for a sooner appointment due to recent rectal bleeding.    PERTINENT  PMH / PSH:  Patient Active Problem List   Diagnosis Date Noted  . Rectal bleeding 07/20/2020  . Abnormal TSH 01/24/2019  . Positive ANA (antinuclear antibody) 01/24/2019  . Thrombocytopenia ([place]) 01/24/2019  . Overweight 01/11/2017  . GERD (gastroesophageal reflux disease) 01/17/2012   OBJECTIVE:   BP 104/68   Pulse 62   Ht 4\' 11"  (1.499 m)   Wt 129 lb (58.5 kg)   LMP 01/12/2012   SpO2 99%   BMI 26.05 kg/m    Physical exam: General: Well-appearing patient, no apparent  distress Respiratory: CTA bilaterally, comfortable work of breathing Cardio: RRR, S1-S2 present, no murmurs appreciated Abdomen: Normal bowel sounds, soft, nontender to palpation, no masses appreciated   ASSESSMENT/PLAN:   Rectal bleeding Has resolved since appointment on 4/27.  Patient reports occasional bright red blood per rectum with painful bowel movements/constipation, denies bleeding is bright red blood, denies brisk bleeding.  No concern for symptomatic anemia.  Most likely due to external hemorrhoid, protruding internal hemorrhoid, anal fissure.  Could also be diverticulosis.  At this point cannot rule out malignancy. -Encouraged patient to take MiraLAX on days when she eats foods that tend to make her more constipated, encouraged thorough hydration -Patient has GI follow-up this Thursday, 08/05/2020 -Strict return precautions provided  Thrombocytopenia ([facility]) 97K platelets on previous CBC had improved up to 160K platelets with hematology/oncology. -Vital signs stable today, patient with brisk cap refill, no rectal bleeding in 4 days -Patient to continue to follow-up with hematology/oncology outpatient -Strict return precautions provided     Daisy Floro, [place]

## 2020-08-04 NOTE — Assessment & Plan Note (Signed)
97K platelets on previous CBC had improved up to 160K platelets with hematology/oncology. -Vital signs stable today, patient with brisk cap refill, no rectal bleeding in 4 days -Patient to continue to follow-up with hematology/oncology outpatient -Strict return precautions provided

## 2020-08-04 NOTE — Assessment & Plan Note (Addendum)
Has resolved since appointment on 4/27.  Patient reports occasional bright red blood per rectum with painful bowel movements/constipation, denies bleeding is bright red blood, denies brisk bleeding.  No concern for symptomatic anemia.  Most likely due to external hemorrhoid, protruding internal hemorrhoid, anal fissure.  Could also be diverticulosis.  At this point cannot rule out malignancy. -Encouraged patient to take MiraLAX on days when she eats foods that tend to make her more constipated, encouraged thorough hydration -Patient has GI follow-up this Thursday, 08/05/2020 -Strict return precautions provided

## 2020-08-05 ENCOUNTER — Encounter: Payer: Self-pay | Admitting: Family Medicine

## 2020-08-05 ENCOUNTER — Ambulatory Visit (HOSPITAL_COMMUNITY)
Admission: RE | Admit: 2020-08-05 | Discharge: 2020-08-05 | Disposition: A | Discharge status: Home / Self Care | Payer: 59 | Source: Ambulatory Visit | Attending: Family Medicine | Admitting: Family Medicine

## 2020-08-05 ENCOUNTER — Ambulatory Visit (HOSPITAL_COMMUNITY): Payer: 59

## 2020-08-05 ENCOUNTER — Encounter: Payer: Self-pay | Admitting: Nurse Practitioner

## 2020-08-05 ENCOUNTER — Telehealth: Payer: Self-pay | Admitting: *Deleted

## 2020-08-05 ENCOUNTER — Ambulatory Visit (INDEPENDENT_AMBULATORY_CARE_PROVIDER_SITE_OTHER): Payer: 59 | Admitting: Nurse Practitioner

## 2020-08-05 ENCOUNTER — Ambulatory Visit (INDEPENDENT_AMBULATORY_CARE_PROVIDER_SITE_OTHER): Payer: 59 | Admitting: Family Medicine

## 2020-08-05 VITALS — BP 108/62 | HR 67 | Ht 60.0 in | Wt 129.0 lb

## 2020-08-05 VITALS — BP 118/84 | HR 64 | Ht 60.0 in | Wt 128.6 lb

## 2020-08-05 DIAGNOSIS — R1012 Left upper quadrant pain: Secondary | ICD-10-CM | POA: Diagnosis not present

## 2020-08-05 DIAGNOSIS — Z789 Other specified health status: Secondary | ICD-10-CM | POA: Diagnosis not present

## 2020-08-05 DIAGNOSIS — D696 Thrombocytopenia, unspecified: Secondary | ICD-10-CM

## 2020-08-05 DIAGNOSIS — R079 Chest pain, unspecified: Secondary | ICD-10-CM

## 2020-08-05 DIAGNOSIS — K625 Hemorrhage of anus and rectum: Secondary | ICD-10-CM

## 2020-08-05 MED ORDER — OMEPRAZOLE 40 MG PO CPDR: 40.0000 mg | DELAYED_RELEASE_CAPSULE | Freq: Every day | ORAL | 1 refills | Status: DC

## 2020-08-05 MED ORDER — NITROGLYCERIN 0.4 MG SL SUBL: 0.4000 mg | SUBLINGUAL_TABLET | SUBLINGUAL | 0 refills | Status: DC | PRN

## 2020-08-05 NOTE — Progress Notes (Signed)
08/05/2020 [place] [id number] 1956-02-15   CHIEF COMPLAINT: Rectal bleeding   HISTORY OF PRESENT ILLNESS:  [place] is a 65 year old female with a past medical history of thrombocytopenia and GERD. S/P tubal ligation 1994. She was referred to our office by Dr. Milus Banister for further evaluation regarding rectal bleeding.  She speaks [place] therefore she presents with a [place] interpreter [place].  Her daughter is also present.  She complains of seeing a small amount of bright red blood per the rectum if she strains to pass a bowel movement occurred for 2 to 3 days approximately 2 weeks ago.  No further rectal bleeding for the past week.  She underwent a colonoscopy by Dr. Hilarie Fredrickson 01/31/2012 which showed internal hemorrhoids, otherwise was normal.  She complains of having left upper quadrant abdominal pain which started approximately 5 to 6 weeks ago and is typically triggered after eating.  She describes feeling bloated.  She complains she does not digest her food well.  She took omeprazole OTC for 14 days and her LUQ pain proved but did not abate.  No dysphagia or heartburn.  No NSAID use.  She underwent an EGD 01/31/2012 which showed mild gastritis, biopsies were negative for H. pylori, no dysplasia or evidence of intestinal metaplasia.  She denies any family history of esophageal, gastric or colon cancer.  She complains of having left chest pain occurs approximately 3 days weekly and last for 5 to 6 hours over the past 3 to 4 months.  She has associated dizziness with mild shortness when she has left chest pain.  Her left chest pain sometimes radiates through to the upper back.  No nausea or vomiting.  Her left chest pain episodes do not occur when eating and typically occur randomly.  She denies having a history of heart disease.  She was seen in the office by Dr. Ouida Sills on 08/03/2020 but she did not inform Dr. Ouida Sills that she was having episodes of  chest pain, dizziness or shortness of breath at that time.  She has a history of thrombocytopenia followed by hematologist Dr. Lorenso Courier.  Most recent laboratory studies 07/30/2020 showed her platelet count increased from 97 to 164. Labs 2/4 and 07/30/2020: Hep B surface antigen negative. Hep B core total antibody negative. Hep C antibody negative. HIV negative.  An abdominal sonogram was discussed if her next labs showed recurrent thrombocytopenia.  No history of liver disease.  No fever, sweats or chills.  Her weight has been stable.  No other complaints at this time.  CBC Latest Ref Rng & Units 07/30/2020 07/20/2020 07/20/2020  WBC 4.0 - 10.5 K/uL 6.0 7.4 -  Hemoglobin 12.0 - 15.0 g/dL 13.9 13.9 14.2  Hematocrit 36.0 - 46.0 % 42.4 43.8 -  Platelets 150 - 400 K/uL 164 97(LL) -    CMP Latest Ref Rng & Units 07/30/2020 05/19/2020 04/30/2020  Glucose 70 - 99 mg/dL 80 95 92  BUN 8 - 23 mg/dL 15 13 18   Creatinine 0.44 - 1.00 mg/dL 0.73 0.76 0.67  Sodium 135 - 145 mmol/L 141 143 142  Potassium 3.5 - 5.1 mmol/L 4.0 3.8 4.1  Chloride 98 - 111 mmol/L 106 107 104  CO2 22 - 32 mmol/L 27 27 24   Calcium 8.9 - 10.3 mg/dL 9.1 9.6 9.7  Total Protein 6.5 - 8.1 g/dL 7.9 8.5(H) 7.1  Total Bilirubin 0.3 - 1.2 mg/dL 0.3 0.7 0.3  Alkaline Phos 38 - 126 U/L  55 51 51  AST 15 - 41 U/L 18 17 20   ALT 0 - 44 U/L 13 14 14     Past Medical History:  Diagnosis Date  . Gastritis   . GERD (gastroesophageal reflux disease)   . Overweight 01/11/2017   Past Surgical History:  Procedure Laterality Date  . TUBAL LIGATION  1994   Social History: She is a non-smoker.  No alcohol.  No drug use.  Family History: No known family history of esophageal, gastric or colon cancer.  No family history of liver or pancreatic cancer.  Brother with alcohol abuse, CVA.  Daughter with gestational diabetes.  No Known Allergies    Outpatient Encounter Medications as of 08/05/2020  Medication Sig  . acetaminophen (TYLENOL) 325 MG tablet Take 650  mg by mouth every 6 (six) hours as needed.   No facility-administered encounter medications on file as of 08/05/2020.    REVIEW OF SYSTEMS:  Gen: Denies fever, sweats or chills. No weight loss.  CV: See HPI. Resp: Denies cough, shortness of breath of hemoptysis.  GI: See HPI.  GU : Denies urinary burning, blood in urine, increased urinary frequency or incontinence. MS: Denies joint pain, muscles aches or weakness. Derm: Denies rash, itchiness, skin lesions or unhealing ulcers. Psych: Denies depression, anxiety or memory loss. Heme: Denies bruising, bleeding. Neuro:  Denies headaches, dizziness or paresthesias. Endo:  Denies any problems with DM, thyroid or adrenal function.   PHYSICAL EXAM: LMP 01/12/2012   BP 108/62   Pulse 67   Ht 5' (1.524 m)   Wt 129 lb (58.5 kg)   LMP 01/12/2012   BMI 25.19 kg/m   General: 65 year old [place] female in no acute distress. Head: Normocephalic and atraumatic. Eyes:  Sclerae non-icteric, conjunctive pink. Ears: Normal auditory acuity. Mouth: Dentition intact. No ulcers or lesions.  Neck: Supple, no lymphadenopathy or thyromegaly.  Lungs: Clear bilaterally to auscultation without wheezes, crackles or rhonchi. Heart: Regular rate and rhythm. No murmur, rub or gallop appreciated.  Abdomen: Soft, nontender, non distended. No masses. No hepatosplenomegaly. Normoactive bowel sounds x 4 quadrants.  Rectal: Posterior/right lateral external hemorrhoids with a thin inflamed hemorrhoid tag without active bleeding.  No blood or mass in the rectum.  CMA [place] present during exam.  Interpreter was not present during the rectal exam but her daughter was present. Musculoskeletal: Symmetrical with no gross deformities. Skin: Warm and dry. No rash or lesions on visible extremities. Extremities: No edema. Neurological: Alert oriented x 4, no focal deficits.  Psychological:  Alert and cooperative. Normal mood and affect.  ASSESSMENT AND PLAN:  55. 65  year old [place] female with rectal bleeding  -Colonoscopy benefits and risks discussed including risk with sedation, risk of bleeding, perforation and infection  -Benefiber daily -Miralax Q HS as needed -Apply a small amount of Desitin inside the anal opening and to the external anal area tid as needed for anal or hemorrhoidal irritation/bleeding.   2. GERD. LUQ pain. Patient is at an Increased risk for gastric cancer secondary to Asian heritage.  EGD in 2013 showed gastritis, no H. Pylori.  -EGD at time of colonoscopy   3. History of thrombocytopenia, followed by hematologist Dr. Lorenso Courier. Hep B surface antigen negative. Hep B core total antibody negative. Hep C antibody negative. HIV negative.  -Abdominal sonogram to assess the liver and spleen  4.  Intermittent left chest pain with associated dizziness and shortness of breath.  The patient verified she was having mild left chest pain  during her office visit today with mild dizziness without shortness of breath.  She was hemodynamically stable, in NAD.  -I advised the patient to go to the the emergency room for cardiac evaluation and EKG, however, the patient and her daughter declined to go to the ED at this time.  Her daughter stated she will call Dr. Ouida Sills to further discuss her mother's chest pain and will await Dr. Tonette Bihari recommendations regarding cardiac evaluation.  EGD and colonoscopy to be scheduled after cardiac clearance received     CC:  Lattie Haw, MD

## 2020-08-05 NOTE — Progress Notes (Addendum)
    SUBJECTIVE:   CHIEF COMPLAINT / HPI:   Chest pain  Chest pain radiating to back and to left arm. Located above left breast. Does not hurt with pressing. Pain is pressure in nature.  Present for four months. Comes and goes. Random. Happens with walking and exertion. Also happens with rest as well.  Episodes last for 4-5 hours. Stretching helps improve, and then goes away on its own. Has mild SOB with chest pain sometimes, but it is typically lasts about for a few minutes and goes away on its own.  Nothing makes it go away. She hasn't tried any medications.  Denies n/v, diaphoresis, palpitations, abdominal pain.  Exercise: yoga qdaily for 30 minutes (has been doing for >1 year) and walking.   Tylenol and vitamin D.  PMHx: heart burn   PERTINENT  PMH / PSH: GERD  OBJECTIVE:   BP 118/84   Pulse 64   Ht 5' (1.524 m)   Wt 128 lb 9.6 oz (58.3 kg)   LMP 01/12/2012   SpO2 98%   BMI 25.12 kg/m   General: well appearing female, NAD, sitting comfortably on exam table. Daughter is present.  HEENT: [place]/AT. PERRLA. EOMI.  Cardiovascular: RRR, normal S1, S2. B/L 2+ RP. No BLEE. 2+ RP, <2s cap refill  Respiratory: CTAB. No IWOB.  Abdomen: + BS. NT, ND, soft to palpation.  Extremities: Warm and well perfused. Moving spontaneously.  Integumentary: No obvious rashes, lesions, trauma on general exam. Neuro:  CN grossly intact. No FND  ASSESSMENT/PLAN:   Chest pain Patient presenting with chest pain x4 months.  Chest pain is atypical in nature. History and physical not consistent with MSK, pulmonary, GI issue.  Her exam is benign today.  EKG without any acute findings except for T wave abnormality in V2 V3.  Recent blood work shows no anemia.  Given persistent pain, will refer to cardiology for possible cardiac cath/further work-up.  Language barrier   [place] interpretor used for entirety of encounter.    Wilber Oliphant, MD [place]

## 2020-08-05 NOTE — Addendum Note (Signed)
Addended by: Zettie Cooley E on: 08/05/2020 08:50 PM   Modules accepted: Level of Service

## 2020-08-05 NOTE — Assessment & Plan Note (Addendum)
Patient presenting with chest pain x4 months.  Chest pain is atypical in nature. History and physical not consistent with MSK, pulmonary, GI issue.  Her exam is benign today.  EKG without any acute findings except for T wave abnormality in V2 V3.  Recent blood work shows no anemia.  Given persistent pain, will refer to cardiology for possible cardiac cath/further work-up.

## 2020-08-05 NOTE — Progress Notes (Signed)
Patient presents to clinic after appointment at [facility] for evaluation of chest pain.   Alerted preceptor (Dr. Erin Hearing) who recommended triage of patient in clinic. Assisted patient to exam room. [place] interpreter used for entire triage. Patient reports left sided chest pain with radiation to arm and back for the last four months. Reports that pain has worsened over the last day. Vital signs WNL. Patient has good color, normal breathing, no diaphoresis observed.   EKG performed. Provided results to Dr. Maudie Mercury who will be evaluating patient further. Patient is in room with daughter. No active distress noted.   Talbot Grumbling, RN

## 2020-08-05 NOTE — Assessment & Plan Note (Signed)
  [place] interpretor used for entirety of encounter.

## 2020-08-05 NOTE — Patient Instructions (Addendum)
N?u b?n t? 66 tu?i tr? xu?ng, ch? s? kh?i c? th? c?a b?n nn t? 19-25. Ch? s? kh?i c? th? c?a b?n l 25,19 kg / m. N?u ?i?u ny n?m ngoi ph?m vi ? nu ???c li?t k, vui lng xem xt lin h? v?i Nh cung c?p d?ch v? ch?m [place] chnh c?a b?n.  THU?C: Chng ti ? g?i lo?i thu?c sau ??n hi?u thu?c c?a b?n ?? b?n c th? ??n l?y m?t cch thu?n ti?n: Pantoprazole 40 MG m?i ngy m?t l?n.   THU?C KHNG K ??N Vui lng mua cc lo?i thu?c sau ?y [place] k ??n v u?ng theo ch? d?n: Benefiber- 1 mu?ng canh m?i ngy. Desitin: Bi m?t l??ng nh? vo vng h?u mn bn ngoi ba l?n m?t ngy n?u c?n.  HNH ?NH:  B?n s? ???c lin h? v?i [place] [facility] Scheduling (ID ng??i g?i c?a b?n s? cho bi?t s? ?i?n tho?i # [phone]) trong 2 ngy t?i ?? ln l?ch Siu m b?ng cho b?n. N?u b?n khng nh?n ???c ph?n h?i t? h? trong vng 2 ngy lm vi?c, vui lng g?i [facility] Scheduling theo s? [phone] ?? theo di tnh hnh cu?c h?n c?a b?n.  KHUY?N NGH?: Ng??i ta [place] b?n nn ??n khoa c?p c?u ?? ?nh gi tnh tr?ng ?au ng?c.  Vui lng g?p Bc s? Tim m?ch c?a b?n ?? ???c thng bo tr??c khi chng ti ln l?ch cho b?t k? th? t?c no. Khi b?n ? ???c thng qua khoa tim m?ch, vui lng g?i cho chng ti theo s? [phone] ?? s?p x?p cc th? t?c c?a b?n.  Th?t vui khi g?p b?n hm nay! C?m ?n b?n ? tin t??ng giao cho ti s? ch?m [place] c?a b?n v l?a ch?n [place].  Noralyn Pick, CRNP    MEDICATION: We have sent the following medication to your pharmacy for you to pick up at your convenience: Pantoprazole 40 MG once a day.     OVER THE COUNTER MEDICATION Please purchase the following medications over the counter and take as directed:  Benefiber- 1 tablespoon daily. Desitin: Apply a small amount to the external anal area three times a day as needed.  IMAGING:  You will be contacted by [facility] (Your caller ID will indicate phone # [phone]) in  the next 2 days to schedule your Abdominal Ultrasound. If you have not heard from them within 2 business days, please call [facility] at [phone] to follow up on the status of your appointment.    RECOMMENDATIONS: It is advised that you go to the emergency department for evaluation of chest pain.  Please see your Cardiologist for clearance before we schedule any procedures. Once you have been cleared by cardiology please call us at [phone] to schedule your procedures.  It was great seeing you today! Thank you for entrusting me with your care and choosing [facility].  Noralyn Pick, CRNP

## 2020-08-05 NOTE — Telephone Encounter (Signed)
Contacted patients daughter and informed her of patients appointment at [facility]. It is schedule on 08/10/2020 @ 11:20am with a 11:00am arrival. It is located at [address]. Number provided as well. Philomene Haff Zimmerman Rumple, CMA

## 2020-08-05 NOTE — Progress Notes (Signed)
Addendum: Reviewed and agree with assessment and management plan. Agree with recommendation for ER evaluation of chest pain with dyspnea and dizziness.  Patient declined. Will need cardiac evaluation and clearance prior to proceeding with endoscopic evaluation as recommended  Ezequias Lard, Lajuan Lines, MD

## 2020-08-05 NOTE — Progress Notes (Signed)
RADIOLOGY SCHEDULING REQUEST SENT TO: [facility] Scheduling via secure staff message.  [facility] Phone: [phone] Fax: [phone]   Patient Name: Beth Brooks DOB: November 14, 1955 MRN #: [id number]  Imaging Ordered: Abd [place]  Diagnosis: LUQ pain  Ordering Provider: Noralyn Pick, CRNP  Is a Prior Authorization needed? We are in the process of obtaining it now  Is the patient Diabetic? No  Does the patient have Hypertension? No  Does the patient have any implanted devices or hardware? No  Date of last BUN/Creat, if needed? N/A  Patient Weight? 129lb  Is the patient able to get on the table? Yes  Has the patient been diagnosed with COVID? No  Is the patient waiting on COVID testing results? No  Thank you for your assistance! [facility]

## 2020-08-05 NOTE — Patient Instructions (Signed)
Referring to cardiology for urgent referral  Try nitroglycerin with chest pain. If it improves the chest pain, let the cardiologist know  If you have severe chest pain, go straight to the emergency department.

## 2020-08-10 ENCOUNTER — Encounter: Payer: Self-pay | Admitting: Interventional Cardiology

## 2020-08-10 ENCOUNTER — Encounter: Payer: Self-pay | Admitting: *Deleted

## 2020-08-10 ENCOUNTER — Other Ambulatory Visit: Payer: Self-pay

## 2020-08-10 ENCOUNTER — Ambulatory Visit (INDEPENDENT_AMBULATORY_CARE_PROVIDER_SITE_OTHER): Payer: 59 | Admitting: Interventional Cardiology

## 2020-08-10 VITALS — BP 124/70 | HR 72 | Ht 60.0 in | Wt 130.0 lb

## 2020-08-10 DIAGNOSIS — R072 Precordial pain: Secondary | ICD-10-CM | POA: Diagnosis not present

## 2020-08-10 DIAGNOSIS — R5383 Other fatigue: Secondary | ICD-10-CM

## 2020-08-10 DIAGNOSIS — R0602 Shortness of breath: Secondary | ICD-10-CM | POA: Diagnosis not present

## 2020-08-10 NOTE — Patient Instructions (Signed)
Medication Instructions:  Your physician recommends that you continue on your current medications as directed. Please refer to the Current Medication list given to you today.  *If you need a refill on your cardiac medications before your next appointment, please call your pharmacy*   Lab Work: none If you have labs (blood work) drawn today and your tests are completely normal, you will receive your results only by: Marland Kitchen MyChart Message (if you have MyChart) OR . A paper copy in the mail If you have any lab test that is abnormal or we need to change your treatment, we will call you to review the results.   Testing/Procedures: Your physician has requested that you have an exercise stress myoview. For further information please visit [email or web address]. Please follow instruction sheet, as given.    Follow-Up: At [facility], you and your health needs are our priority.  As part of our continuing mission to provide you with exceptional heart care, we have created designated Provider Care Teams.  These Care Teams include your primary Cardiologist (physician) and Advanced Practice Providers (APPs -  Physician Assistants and Nurse Practitioners) who all work together to provide you with the care you need, when you need it.  We recommend signing up for the patient portal called "MyChart".  Sign up information is provided on this After Visit Summary.  MyChart is used to connect with patients for Virtual Visits (Telemedicine).  Patients are able to view lab/test results, encounter notes, upcoming appointments, etc.  Non-urgent messages can be sent to your provider as well.   To learn more about what you can do with MyChart, go to [email or web address].    Your next appointment:   Based on test results  The format for your next appointment:   In Person  Provider:   You may see Larae Grooms, MD or one of the following Advanced Practice Providers on your designated Care Team:    Melina Copa, PA-C  Ermalinda Barrios, PA-C    Other Instructions

## 2020-08-10 NOTE — Progress Notes (Signed)
Cardiology Office Note   Date:  08/10/2020   ID:  Beth Brooks, Beth Brooks October 09, 1955, MRN [id number]  PCP:  Lattie Haw, MD    No chief complaint on file.  Chest pain  Wt Readings from Last 3 Encounters:  08/10/20 130 lb (59 kg)  08/05/20 128 lb 9.6 oz (58.3 kg)  08/05/20 129 lb (58.5 kg)       History of Present Illness: Beth Brooks is a 65 y.o. female who is being seen today for the evaluation of chest discomfort at the request of Lind Covert, *.  Prior records show: "Chest pain radiating to back and to left arm. Located above left breast. Does not hurt with pressing. Pain is pressure in nature.  Present for four months. Comes and goes. Random. Happens with walking and exertion. Also happens with rest as well.  Episodes last for 4-5 hours. Stretching helps improve, and then goes away on its own. Has mild SOB with chest pain sometimes, but it is typically lasts about for a few minutes and goes away on its own.  Nothing makes it go away. She hasn't tried any medications.  Denies n/v, diaphoresis, palpitations, abdominal pain.  Exercise: yoga qdaily for 30 minutes (has been doing for >1 year) and walking. "  She continues to feel tired and has some shortness of breath.  Yoga does not bring on the symptoms.   Denies :   Leg edema. Nitroglycerin use. Orthopnea. Palpitations. Paroxysmal nocturnal dyspnea. Syncope.      Past Medical History:  Diagnosis Date  . Chest pain   . Gastritis   . GERD (gastroesophageal reflux disease)   . SOB (shortness of breath)     Past Surgical History:  Procedure Laterality Date  . TUBAL LIGATION  1994     Current Outpatient Medications  Medication Sig Dispense Refill  . acetaminophen (TYLENOL) 325 MG tablet Take 650 mg by mouth every 6 (six) hours as needed.    . nitroGLYCERIN (NITROSTAT) 0.4 MG SL tablet Place 1 tablet (0.4 mg total) under the tongue every 5 (five) minutes as needed for chest pain. 30 tablet 0  .  omeprazole (PRILOSEC) 40 MG capsule Take 1 capsule (40 mg total) by mouth daily. 30 capsule 1   No current facility-administered medications for this visit.    Allergies:   Patient has no known allergies.    Social History:  The patient  reports that she has never smoked. She has never used smokeless tobacco. She reports that she does not drink alcohol and does not use drugs.   Family History:  The patient's family history includes Alcohol abuse in her brother; Cerebrovascular Accident in her brother; Gestational diabetes in her daughter.    ROS:  Please see the history of present illness.   Otherwise, review of systems are positive for chest pain.   All other systems are reviewed and negative.    PHYSICAL EXAM: VS:  BP 124/70   Pulse 72   Ht 5' (1.524 m)   Wt 130 lb (59 kg)   LMP 01/12/2012   SpO2 98%   BMI 25.39 kg/m  , BMI Body mass index is 25.39 kg/m. GEN: Well nourished, well developed, in no acute distress  HEENT: normal  Neck: no JVD, carotid bruits, or masses Cardiac: RRR; no murmurs, rubs, or gallops,no edema  Respiratory:  clear to auscultation bilaterally, normal work of breathing GI: soft, nontender, nondistended, + BS MS: no deformity or atrophy  Skin:  warm and dry, no rash Neuro:  Strength and sensation are intact Psych: euthymic mood, full affect   EKG:   The ekg ordered 5/12 demonstrates NSR, anterior ST changes with TWI   Recent Labs: 04/30/2020: TSH 0.802 07/30/2020: ALT 13; BUN 15; Creatinine 0.73; Hemoglobin 13.9; Platelet Count 164; Potassium 4.0; Sodium 141   Lipid Panel    Component Value Date/Time   CHOL 194 04/30/2020 1430   TRIG 113 04/30/2020 1430   HDL 56 04/30/2020 1430   CHOLHDL 3.5 04/30/2020 1430   LDLCALC 118 (H) 04/30/2020 1430     Other studies Reviewed: Additional studies/ records that were reviewed today with results demonstrating: Prior records reviewed.   ASSESSMENT AND PLAN:  1. Chest pain : Some typical and some  atypical features.  ECG is abnormal so will need imaging with a treadmill test.  We will plan for exercise Myoview. 2. SHOB: Nuclear stress test will also give [place] ejection fraction information. 3. Fatigue: We spoke about the long-term benefits of regular exercise.  Once her cardiac testing is done, we will try to give her an exercise target and heart rate target to be achieved to help her stamina.   Current medicines are reviewed at length with the patient today.  The patient concerns regarding her medicines were addressed.  The following changes have been made:  No change  Labs/ tests ordered today include:  No orders of the defined types were placed in this encounter.   Recommend 150 minutes/week of aerobic exercise Low fat, low carb, high fiber diet recommended  Disposition:   FU for stress test   Signed, Larae Grooms, MD  08/10/2020 11:41 AM    [facility] [address], [place], [place]  [address] Phone: [phone]; Fax: [phone]

## 2020-08-13 ENCOUNTER — Other Ambulatory Visit: Payer: Self-pay

## 2020-08-13 ENCOUNTER — Ambulatory Visit (HOSPITAL_COMMUNITY)
Admission: RE | Admit: 2020-08-13 | Discharge: 2020-08-13 | Disposition: A | Discharge status: Home / Self Care | Payer: 59 | Source: Ambulatory Visit | Attending: Nurse Practitioner | Admitting: Nurse Practitioner

## 2020-08-13 DIAGNOSIS — D696 Thrombocytopenia, unspecified: Secondary | ICD-10-CM | POA: Insufficient documentation

## 2020-08-13 DIAGNOSIS — K625 Hemorrhage of anus and rectum: Secondary | ICD-10-CM | POA: Diagnosis not present

## 2020-08-13 DIAGNOSIS — R079 Chest pain, unspecified: Secondary | ICD-10-CM | POA: Insufficient documentation

## 2020-08-13 DIAGNOSIS — R1012 Left upper quadrant pain: Secondary | ICD-10-CM

## 2020-08-16 ENCOUNTER — Telehealth (HOSPITAL_COMMUNITY): Payer: Self-pay | Admitting: *Deleted

## 2020-08-16 NOTE — Telephone Encounter (Signed)
Left message on voicemail per DPR in reference to upcoming appointment scheduled on 08/20/20 at 10:45 with detailed instructions given per Myocardial Perfusion Study Information Sheet for the test. LM to arrive 15 minutes early, and that it is imperative to arrive on time for appointment to keep from having the test rescheduled. If you need to cancel or reschedule your appointment, please call the office within 24 hours of your appointment. Failure to do so may result in a cancellation of your appointment, and a $50 no show fee. Phone number given for call back for any questions.

## 2020-08-18 ENCOUNTER — Other Ambulatory Visit: Payer: Self-pay | Admitting: *Deleted

## 2020-08-18 DIAGNOSIS — R072 Precordial pain: Secondary | ICD-10-CM

## 2020-08-20 ENCOUNTER — Other Ambulatory Visit: Payer: Self-pay

## 2020-08-20 ENCOUNTER — Ambulatory Visit (HOSPITAL_COMMUNITY): Payer: 59 | Attending: Cardiology

## 2020-08-20 DIAGNOSIS — R072 Precordial pain: Secondary | ICD-10-CM | POA: Diagnosis present

## 2020-08-20 LAB — MYOCARDIAL PERFUSION IMAGING
LV dias vol: 38 mL (ref 46–106)
LV sys vol: 5 mL
Peak HR: 126 {beats}/min
Rest HR: 67 {beats}/min
SDS: 1
SRS: 0
SSS: 1
TID: 0.79

## 2020-08-20 MED ORDER — TECHNETIUM TC 99M TETROFOSMIN IV KIT
31.5000 | PACK | Freq: Once | INTRAVENOUS | Status: AC | PRN
Start: 2020-08-20 — End: 2020-08-20
  Administered 2020-08-20: 31.5 via INTRAVENOUS
  Filled 2020-08-20: qty 32

## 2020-08-20 MED ORDER — TECHNETIUM TC 99M TETROFOSMIN IV KIT
9.8000 | PACK | Freq: Once | INTRAVENOUS | Status: AC | PRN
  Administered 2020-08-20: 9.8 via INTRAVENOUS
  Filled 2020-08-20: qty 10

## 2020-08-20 MED ORDER — REGADENOSON 0.4 MG/5ML IV SOLN
0.4000 mg | Freq: Once | INTRAVENOUS | Status: AC
Start: 2020-08-20 — End: 2020-08-20
  Administered 2020-08-20: 0.4 mg via INTRAVENOUS

## 2020-08-24 NOTE — Progress Notes (Signed)
Rescheduled

## 2020-08-25 ENCOUNTER — Inpatient Hospital Stay: Payer: 59 | Admitting: Hematology and Oncology

## 2020-08-25 ENCOUNTER — Other Ambulatory Visit: Payer: 59

## 2020-11-01 ENCOUNTER — Ambulatory Visit
Admission: RE | Admit: 2020-11-01 | Discharge: 2020-11-01 | Disposition: A | Discharge status: Home / Self Care | Payer: 59 | Source: Ambulatory Visit | Attending: General Surgery | Admitting: General Surgery

## 2020-11-01 ENCOUNTER — Other Ambulatory Visit: Payer: Self-pay

## 2020-11-01 DIAGNOSIS — Z0000 Encounter for general adult medical examination without abnormal findings: Secondary | ICD-10-CM

## 2020-12-01 ENCOUNTER — Inpatient Hospital Stay: Payer: 59 | Attending: Hematology and Oncology

## 2020-12-01 ENCOUNTER — Encounter: Payer: Self-pay | Admitting: Hematology and Oncology

## 2020-12-01 ENCOUNTER — Other Ambulatory Visit: Payer: Self-pay | Admitting: Hematology and Oncology

## 2020-12-01 ENCOUNTER — Inpatient Hospital Stay (HOSPITAL_BASED_OUTPATIENT_CLINIC_OR_DEPARTMENT_OTHER): Payer: 59 | Admitting: Hematology and Oncology

## 2020-12-01 ENCOUNTER — Other Ambulatory Visit: Payer: Self-pay

## 2020-12-01 DIAGNOSIS — Z8719 Personal history of other diseases of the digestive system: Secondary | ICD-10-CM | POA: Diagnosis not present

## 2020-12-01 DIAGNOSIS — R079 Chest pain, unspecified: Secondary | ICD-10-CM | POA: Insufficient documentation

## 2020-12-01 DIAGNOSIS — D696 Thrombocytopenia, unspecified: Secondary | ICD-10-CM | POA: Insufficient documentation

## 2020-12-01 DIAGNOSIS — K649 Unspecified hemorrhoids: Secondary | ICD-10-CM

## 2020-12-01 DIAGNOSIS — K219 Gastro-esophageal reflux disease without esophagitis: Secondary | ICD-10-CM | POA: Diagnosis not present

## 2020-12-01 DIAGNOSIS — K760 Fatty (change of) liver, not elsewhere classified: Secondary | ICD-10-CM | POA: Insufficient documentation

## 2020-12-01 LAB — CBC WITH DIFFERENTIAL (CANCER CENTER ONLY)
Abs Immature Granulocytes: 0.01 10*3/uL (ref 0.00–0.07)
Basophils Absolute: 0 10*3/uL (ref 0.0–0.1)
Basophils Relative: 0 %
Eosinophils Absolute: 0.3 10*3/uL (ref 0.0–0.5)
Eosinophils Relative: 4 %
HCT: 43.2 % (ref 36.0–46.0)
Hemoglobin: 14 g/dL (ref 12.0–15.0)
Immature Granulocytes: 0 %
Lymphocytes Relative: 43 %
Lymphs Abs: 2.8 10*3/uL (ref 0.7–4.0)
MCH: 29.1 pg (ref 26.0–34.0)
MCHC: 32.4 g/dL (ref 30.0–36.0)
MCV: 89.8 fL (ref 80.0–100.0)
Monocytes Absolute: 0.5 10*3/uL (ref 0.1–1.0)
Monocytes Relative: 7 %
Neutro Abs: 3 10*3/uL (ref 1.7–7.7)
Neutrophils Relative %: 46 %
Platelet Count: 124 10*3/uL — ABNORMAL LOW (ref 150–400)
RBC: 4.81 MIL/uL (ref 3.87–5.11)
RDW: 13.4 % (ref 11.5–15.5)
WBC Count: 6.6 10*3/uL (ref 4.0–10.5)
nRBC: 0 % (ref 0.0–0.2)

## 2020-12-01 LAB — CMP (CANCER CENTER ONLY)
ALT: 10 U/L (ref 0–44)
AST: 15 U/L (ref 15–41)
Albumin: 4.2 g/dL (ref 3.5–5.0)
Alkaline Phosphatase: 53 U/L (ref 38–126)
Anion gap: 11 (ref 5–15)
BUN: 23 mg/dL (ref 8–23)
CO2: 28 mmol/L (ref 22–32)
Calcium: 9.8 mg/dL (ref 8.9–10.3)
Chloride: 105 mmol/L (ref 98–111)
Creatinine: 0.96 mg/dL (ref 0.44–1.00)
GFR, Estimated: >60 mL/min (ref >60)
Glucose, Bld: 134 mg/dL — ABNORMAL HIGH (ref 70–99)
Potassium: 4.2 mmol/L (ref 3.5–5.1)
Sodium: 144 mmol/L (ref 135–145)
Total Bilirubin: 0.4 mg/dL (ref 0.3–1.2)
Total Protein: 7.8 g/dL (ref 6.5–8.1)

## 2020-12-01 NOTE — Progress Notes (Signed)
[place] [phone]   [phone]  PROGRESS NOTE  Patient Care Team: Lattie Haw, MD as PCP - General (Family Medicine) Maximiano Coss, NP as PCP - Family Medicine (Adult Health Nurse Practitioner) Jettie Booze, MD as PCP - Cardiology (Cardiology)  Hematological/Oncological History # Thrombocytopenia 01/08/2017: WBC 5.0, Hgb 15.1, Plt 127, MCV 89 12/23/2018: WBC 5.8, Hgb 15.3, Plt 106, MCV 86 04/30/2020: WBC 5.2, Hgb 14.3, MCV 90, Plt 99 05/19/2020: establish care with Dr. Lorenso Courier  07/30/2020: WBC 6.0, Hgb 13.9, MCV 90.6, Plt 164  Interval History:  Beth Brooks 65 y.o. female with medical history significant for thrombocytopenia of unclear etiology who presents for a follow up visit. The patient's last visit was on 07/30/2020. In the interim since the last visit she has had no change in health.  On exam today Beth Brooks is unaccompanied.  Our discussions are assisted by a hospital approved interpreter. She reports she is felt much better in the interim since her last visit.  She has not been having any further episodes of blood in the stool.  She notes that her energy is little bit better and she is not having any systemic symptoms.  She denies any pain in her abdomen.  Her weight has been stable and her appetite has been good.  She otherwise denies any fevers, chills, sweats, nausea, vomiting or diarrhea.  A full 10 point ROS is listed below.  MEDICAL HISTORY:  Past Medical History:  Diagnosis Date   Chest pain    Gastritis    GERD (gastroesophageal reflux disease)    SOB (shortness of breath)     SURGICAL HISTORY: Past Surgical History:  Procedure Laterality Date   TUBAL LIGATION  1994    SOCIAL HISTORY: Social History   Socioeconomic History   Marital status: Widowed    Spouse name: Eddie Dibbles Adup   Number of children: 8   Years of education: 8   Highest education level: Not on file  Occupational History   Occupation:  Housewife  Tobacco Use   Smoking status: Never   Smokeless tobacco: Never  Vaping Use   Vaping Use: Never used  Substance and Sexual Activity   Alcohol use: No   Drug use: No   Sexual activity: Not Currently    Comment: married  Other Topics Concern   Not on file  Social History Narrative   Originally from [place]   [place]   Came to [place] In [place] with her husband and one daughter.   Husband with hepatic carcinoma, for which he has been treated past 3 years.     She has support to give her time off.   Son who interprets takes her husband for chemo twice weekly.   Social Determinants of Health   Financial Resource Strain: Not on file  Food Insecurity: Not on file  Transportation Needs: Not on file  Physical Activity: Not on file  Stress: Not on file  Social Connections: Not on file  Intimate Partner Violence: Not on file    FAMILY HISTORY: Family History  Problem Relation Age of Onset   Gestational diabetes Daughter    Alcohol abuse Brother    Cerebrovascular Accident Brother        "blood clot on brain"  [place] now.   Breast cancer Neg Hx     ALLERGIES:  has No Known Allergies.  MEDICATIONS:  Current Outpatient Medications  Medication Sig Dispense Refill   acetaminophen (TYLENOL) 325 MG  tablet Take 650 mg by mouth every 6 (six) hours as needed.     nitroGLYCERIN (NITROSTAT) 0.4 MG SL tablet Place 1 tablet (0.4 mg total) under the tongue every 5 (five) minutes as needed for chest pain. 30 tablet 0   omeprazole (PRILOSEC) 40 MG capsule Take 1 capsule (40 mg total) by mouth daily. 30 capsule 1   No current facility-administered medications for this visit.    REVIEW OF SYSTEMS:   Constitutional: ( - ) fevers, ( - )  chills , ( - ) night sweats Eyes: ( - ) blurriness of vision, ( - ) double vision, ( - ) watery eyes Ears, nose, mouth, throat, and face: ( - ) mucositis, ( - ) sore throat Respiratory: ( - ) cough, ( - ) dyspnea, ( - ) wheezes Cardiovascular: ( -  ) palpitation, ( - ) chest discomfort, ( - ) lower extremity swelling Gastrointestinal:  ( - ) nausea, ( - ) heartburn, ( - ) change in bowel habits Skin: ( - ) abnormal skin rashes Lymphatics: ( - ) new lymphadenopathy, ( - ) easy bruising Neurological: ( - ) numbness, ( - ) tingling, ( - ) new weaknesses Behavioral/Psych: ( - ) mood change, ( - ) new changes  All other systems were reviewed with the patient and are negative.  PHYSICAL EXAMINATION:  There were no vitals filed for this visit.  There were no vitals filed for this visit.   GENERAL: well appearing middle aged [place] female in NAD. alert, no distress and comfortable SKIN: skin color, texture, turgor are normal, no rashes or significant lesions EYES: conjunctiva are pink and non-injected, sclera clear LUNGS: clear to auscultation and percussion with normal breathing effort HEART: regular rate & rhythm and no murmurs and no lower extremity edema Musculoskeletal: no cyanosis of digits and no clubbing  PSYCH: alert & oriented x 3, fluent speech NEURO: no focal motor/sensory deficits  LABORATORY DATA:  I have reviewed the data as listed CBC Latest Ref Rng & Units 12/01/2020 07/30/2020 07/20/2020  WBC 4.0 - 10.5 K/uL 6.6 6.0 7.4  Hemoglobin 12.0 - 15.0 g/dL 14.0 13.9 13.9  Hematocrit 36.0 - 46.0 % 43.2 42.4 43.8  Platelets 150 - 400 K/uL 124(L) 164 97(LL)    CMP Latest Ref Rng & Units 12/01/2020 07/30/2020 05/19/2020  Glucose 70 - 99 mg/dL 134(H) 80 95  BUN 8 - 23 mg/dL '23 15 13  '$ Creatinine 0.44 - 1.00 mg/dL 0.96 0.73 0.76  Sodium 135 - 145 mmol/L 144 141 143  Potassium 3.5 - 5.1 mmol/L 4.2 4.0 3.8  Chloride 98 - 111 mmol/L 105 106 107  CO2 22 - 32 mmol/L '28 27 27  '$ Calcium 8.9 - 10.3 mg/dL 9.8 9.1 9.6  Total Protein 6.5 - 8.1 g/dL 7.8 7.9 8.5(H)  Total Bilirubin 0.3 - 1.2 mg/dL 0.4 0.3 0.7  Alkaline Phos 38 - 126 U/L 53 55 51  AST 15 - 41 U/L '15 18 17  '$ ALT 0 - 44 U/L '10 13 14    '$ RADIOGRAPHIC STUDIES: No results  found.  ASSESSMENT & PLAN Beth Brooks 65 y.o. female with medical history significant for thrombocytopenia of unclear etiology who presents for a follow up visit.  After review the labs, the records, schedule the patient the findings most consistent with a thrombocytopenia of unclear etiology.  Possible etiologies could include liver disease or nutritional deficiency.  The patient has been screened for hep C and HIV earlier this month and been  found to be negative. 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  She is already established care with gastroenterology who intend to discuss nonalcoholic fatty liver disease at her next clinic visit.  #Thrombocytopenia, Mild, chronic.  --findings at this time are concerning for thrombocytopenia of unclear etiology. No correction in citrate with no clear etiology on prior nutritional labs.  --additionally will order CMP and CBC today --no evidence of clumping or clear platelet abnormality on peripheral blood film.  --negative screens for Hep C and HIV on 04/30/2020. Hepatitis B testing today. --nutritional assessment with Vitamin b12 and folate negative --MM eval with SPEP and SFLC negative  --platelets decreased to 124 today. Appear highly fluctuant. 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 Patient established care with GI --RTC in 6 months   No orders of the defined types were placed in this encounter.  All questions were answered. The patient knows to call the clinic with any problems, questions or concerns.  A total of more than 30 minutes were spent on this encounter and over half of that time was spent on counseling and coordination of care as outlined above.   Ledell Peoples, MD Department of Hematology/Oncology [place] [facility] Phone: [phone] Pager: [phone] Email: [email or web address]  12/01/2020 4:16 PM

## 2021-01-05 ENCOUNTER — Telehealth: Payer: Self-pay | Admitting: *Deleted

## 2021-01-05 NOTE — Telephone Encounter (Signed)
Patient's daughter, Vicente Males called on mother's behalf. She stated mother had been referred to gastroenterologist at last appt with Dr. Lorenso Courier in September. She states no one has called them to arrange appt.  Referral was sent 12/01/20 to to [facility]. Contacted [facility] - Phone: [phone] . Gave information to patient coordinator. She stated patient would be contacted today for appt.  Contacted daughter Vicente Males with this information - asked her to contact this office if she has not heard from [facility] in next day. She verbalized understanding

## 2021-01-31 ENCOUNTER — Inpatient Hospital Stay: Payer: 59

## 2021-01-31 ENCOUNTER — Inpatient Hospital Stay: Payer: 59 | Attending: Hematology and Oncology | Admitting: Hematology and Oncology

## 2021-02-09 ENCOUNTER — Other Ambulatory Visit: Payer: Self-pay

## 2021-02-09 ENCOUNTER — Ambulatory Visit (HOSPITAL_COMMUNITY)
Admission: EM | Admit: 2021-02-09 | Discharge: 2021-02-09 | Disposition: A | Discharge status: Home / Self Care | Payer: 59 | Attending: Emergency Medicine | Admitting: Emergency Medicine

## 2021-02-09 ENCOUNTER — Ambulatory Visit (INDEPENDENT_AMBULATORY_CARE_PROVIDER_SITE_OTHER): Payer: 59

## 2021-02-09 ENCOUNTER — Encounter (HOSPITAL_COMMUNITY): Payer: Self-pay | Admitting: Emergency Medicine

## 2021-02-09 DIAGNOSIS — R059 Cough, unspecified: Secondary | ICD-10-CM | POA: Diagnosis not present

## 2021-02-09 DIAGNOSIS — R0602 Shortness of breath: Secondary | ICD-10-CM

## 2021-02-09 DIAGNOSIS — J069 Acute upper respiratory infection, unspecified: Secondary | ICD-10-CM | POA: Diagnosis not present

## 2021-02-09 MED ORDER — ALBUTEROL SULFATE HFA 108 (90 BASE) MCG/ACT IN AERS: 1.0000 | INHALATION_SPRAY | Freq: Four times a day (QID) | RESPIRATORY_TRACT | 0 refills | Status: DC | PRN

## 2021-02-09 MED ORDER — FLUTICASONE PROPIONATE 50 MCG/ACT NA SUSP: 1.0000 | Freq: Every day | NASAL | 2 refills | Status: DC

## 2021-02-09 MED ORDER — PREDNISONE 10 MG (21) PO TBPK: ORAL_TABLET | Freq: Every day | ORAL | 0 refills | Status: DC

## 2021-02-09 NOTE — ED Provider Notes (Signed)
[place]    CSN: [id number] Arrival date & time: 02/09/21  1108      History   Chief Complaint Chief Complaint  Patient presents with   Nasal Congestion   Fatigue    HPI Beth Brooks is a 65 y.o. female.   Interpt used. Daughter at bedside Pt has been having cough congestion and chills at home. States that she was taking a pill and it helped last night. Pt pulled out nitro SL. Pt states that she has not been taking this. Denies any chest pain no n/v/d. Pt states that she has also been losing weight approx 15 lbs in the past 2 months with out trying to loose wt.    Past Medical History:  Diagnosis Date   Chest pain    Gastritis    GERD (gastroesophageal reflux disease)    SOB (shortness of breath)     Patient Active Problem List   Diagnosis Date Noted   Chest pain 08/05/2020   Language barrier 08/05/2020   Rectal bleeding 07/20/2020   Abnormal TSH 01/24/2019   Positive ANA (antinuclear antibody) 01/24/2019   Thrombocytopenia ([place]) 01/24/2019   Overweight 01/11/2017   GERD (gastroesophageal reflux disease) 01/17/2012    Past Surgical History:  Procedure Laterality Date   TUBAL LIGATION  1994    OB History   No obstetric history on file.      Home Medications    Prior to Admission medications   Medication Sig Start Date End Date Taking? Authorizing Provider  albuterol (VENTOLIN HFA) 108 (90 Base) MCG/ACT inhaler Inhale 1-2 puffs into the lungs every 6 (six) hours as needed for wheezing or shortness of breath. 02/09/21  Yes Marney Setting, NP  fluticasone (FLONASE) 50 MCG/ACT nasal spray Place 1 spray into both nostrils daily. 02/09/21  Yes Marney Setting, NP  predniSONE (STERAPRED UNI-PAK 21 TAB) 10 MG (21) TBPK tablet Take by mouth daily. Take 6 tabs by mouth daily  for 2 days, then 5 tabs for 2 days, then 4 tabs for 2 days, then 3 tabs for 2 days, 2 tabs for 2 days, then 1 tab by mouth daily for 2 days 02/09/21  Yes Marney Setting, NP  acetaminophen (TYLENOL) 325 MG tablet Take 650 mg by mouth every 6 (six) hours as needed.    [provider]  nitroGLYCERIN (NITROSTAT) 0.4 MG SL tablet Place 1 tablet (0.4 mg total) under the tongue every 5 (five) minutes as needed for chest pain. 08/05/20   Wilber Oliphant, MD  omeprazole (PRILOSEC) 40 MG capsule Take 1 capsule (40 mg total) by mouth daily. 08/05/20   Noralyn Pick, NP    Family History Family History  Problem Relation Age of Onset   Gestational diabetes Daughter    Alcohol abuse Brother    Cerebrovascular Accident Brother        "blood clot on brain"  [place] now.   Breast cancer Neg Hx     Social History Social History   Tobacco Use   Smoking status: Never   Smokeless tobacco: Never  Vaping Use   Vaping Use: Never used  Substance Use Topics   Alcohol use: No   Drug use: No     Allergies   Patient has no known allergies.   Review of Systems Review of Systems  Constitutional:  Positive for chills. Negative for fever.  HENT:  Positive for congestion, postnasal drip, rhinorrhea, sinus pressure, sinus pain and sore throat.  Respiratory:  Positive for cough. Negative for shortness of breath.   Cardiovascular: Negative.   Gastrointestinal: Negative.   Genitourinary: Negative.   Musculoskeletal: Negative.   Neurological: Negative.     Physical Exam Triage Vital Signs ED Triage Vitals  Enc Vitals Group     BP 02/09/21 1157 123/69     Pulse Rate 02/09/21 1157 69     Resp 02/09/21 1157 20     Temp 02/09/21 1157 98.5 F (36.9 C)     Temp Source 02/09/21 1157 Oral     SpO2 02/09/21 1157 99 %     Weight 02/09/21 1207 124 lb (56.2 kg)     Height --      Head Circumference --      Peak Flow --      Pain Score 02/09/21 1201 0     Pain Loc --      Pain Edu? --      Excl. in [place]? --    No data found.  Updated Vital Signs BP 123/69 (BP Location: Left Arm)   Pulse 69   Temp 98.5 F (36.9 C) (Oral)   Resp 20   Wt 124  lb (56.2 kg)   LMP 01/12/2012   SpO2 99%   BMI 24.22 kg/m   Visual Acuity Right Eye Distance:   Left Eye Distance:   Bilateral Distance:    Right Eye Near:   Left Eye Near:    Bilateral Near:     Physical Exam Constitutional:      Appearance: Normal appearance. She is normal weight.  HENT:     Right Ear: Tympanic membrane normal.     Left Ear: Tympanic membrane normal.     Nose: Congestion and rhinorrhea present.     Mouth/Throat:     Mouth: Mucous membranes are moist.  Eyes:     Conjunctiva/sclera: Conjunctivae normal.     Pupils: Pupils are equal, round, and reactive to light.  Cardiovascular:     Rate and Rhythm: Normal rate.  Pulmonary:     Effort: Pulmonary effort is normal.  Abdominal:     General: Abdomen is flat.  Skin:    General: Skin is warm.     Capillary Refill: Capillary refill takes less than 2 seconds.  Neurological:     General: No focal deficit present.     Mental Status: She is alert.     UC Treatments / Results  Labs (all labs ordered are listed, but only abnormal results are displayed) Labs Reviewed - No data to display  EKG   Radiology DG Chest 2 View  Result Date: 02/09/2021 CLINICAL DATA:  Shortness of breath, cough. EXAM: CHEST - 2 VIEW COMPARISON:  January 28, 2012. FINDINGS: The heart size and mediastinal contours are within normal limits. Both lungs are clear. The visualized skeletal structures are unremarkable. IMPRESSION: No active cardiopulmonary disease. Electronically Signed   By: Marijo Conception M.D.   On: 02/09/2021 13:12    Procedures Procedures (including critical care time)  Medications Ordered in UC Medications - No data to display  Initial Impression / Assessment and Plan / UC Course  I have reviewed the triage vital signs and the nursing notes.  Pertinent labs & imaging results that were available during my care of the patient were reviewed by me and considered in my medical decision making (see chart for  details).     Cont to take tylenol motrin  Can take otc cold and cough avoid any  meds with decongestants can elevated bp  Stay hydrated well  Most likely the viral cold like symptoms  If sx become worse go to  er  Ekg completed and reviewed to previous ekg.  X ray was normal  Final Clinical Impressions(s) / UC Diagnoses   Final diagnoses:  Viral URI with cough   Discharge Instructions   None    ED Prescriptions     Medication Sig Dispense Auth. Provider   predniSONE (STERAPRED UNI-PAK 21 TAB) 10 MG (21) TBPK tablet Take by mouth daily. Take 6 tabs by mouth daily  for 2 days, then 5 tabs for 2 days, then 4 tabs for 2 days, then 3 tabs for 2 days, 2 tabs for 2 days, then 1 tab by mouth daily for 2 days 42 tablet Morley Kos L, NP   fluticasone (FLONASE) 50 MCG/ACT nasal spray Place 1 spray into both nostrils daily. 9.9 mL Morley Kos L, NP   albuterol (VENTOLIN HFA) 108 (90 Base) MCG/ACT inhaler Inhale 1-2 puffs into the lungs every 6 (six) hours as needed for wheezing or shortness of breath. 90 g Marney Setting, NP      PDMP not reviewed this encounter.   Marney Setting, NP 02/09/21 1345

## 2021-02-09 NOTE — ED Triage Notes (Signed)
Patient c/o nasal congestion and fatigue x 2 weeks.   Patient endorses chills at times.   Patient endorses itchy throat. Patient endorses ABD pain.   Patient endorses nausea.   Patient denies emesis or diarrhea.   Patient has taken Theraflu and Mucinex with no relief of symptoms.

## 2021-02-28 ENCOUNTER — Telehealth: Payer: Self-pay | Admitting: *Deleted

## 2021-02-28 NOTE — Telephone Encounter (Signed)
Received call from pt's daughter, Vicente Males inquring about referral to [facility]. This was done in September 2022 and they have not received a call from that office. RE-fax'd referral to that office. Attempted to call but was on hold for an extended amount of time-unable to talk with anyone.

## 2021-03-07 ENCOUNTER — Ambulatory Visit (INDEPENDENT_AMBULATORY_CARE_PROVIDER_SITE_OTHER): Payer: 59 | Admitting: Physician Assistant

## 2021-03-07 ENCOUNTER — Encounter: Payer: Self-pay | Admitting: Physician Assistant

## 2021-03-07 VITALS — BP 122/60 | HR 80 | Ht 59.0 in | Wt 126.0 lb

## 2021-03-07 DIAGNOSIS — K219 Gastro-esophageal reflux disease without esophagitis: Secondary | ICD-10-CM | POA: Diagnosis not present

## 2021-03-07 DIAGNOSIS — K625 Hemorrhage of anus and rectum: Secondary | ICD-10-CM

## 2021-03-07 DIAGNOSIS — K760 Fatty (change of) liver, not elsewhere classified: Secondary | ICD-10-CM | POA: Diagnosis not present

## 2021-03-07 DIAGNOSIS — D696 Thrombocytopenia, unspecified: Secondary | ICD-10-CM

## 2021-03-07 MED ORDER — NA SULFATE-K SULFATE-MG SULF 17.5-3.13-1.6 GM/177ML PO SOLN: 1.0000 | Freq: Once | ORAL | 0 refills | Status: AC

## 2021-03-07 MED ORDER — OMEPRAZOLE 40 MG PO CPDR: 40.0000 mg | DELAYED_RELEASE_CAPSULE | Freq: Two times a day (BID) | ORAL | 5 refills | Status: DC

## 2021-03-07 NOTE — Progress Notes (Signed)
Chief Complaint: Fatty liver, anemia and fatigue  HPI:    Beth Brooks is a 65 year old [place] female with a past medical history of reflux and others listed below, known to Dr. Hilarie Fredrickson, who was referred to me by Lattie Haw, MD for a complaint of fatty liver, anemia and fatigue.    01/31/2012 colonoscopy and EGD.  Colonoscopy with small internal hemorrhoids and otherwise normal.  Repeat recommended 10 years.  EGD with gastritis and duodenitis and otherwise normal.  Pathology with mild gastritis and otherwise normal.  Negative for H. pylori.    07/30/2020 iron and TIBC as well as ferritin normal.  Hepatitis screening for B and C at the time were negative.    08/05/2020 patient seen in clinic by Carl Best, NP and at that time and recommended an EGD and colonoscopy but the patient was having intermittent chest pain so she was told to be followed by cardiology first.    08/10/2020 patient followed with cardiology and ended up having a stress test and EKG all of which were normal.    08/14/2020 abdominal ultrasound with mild increased echogenicity in the liver which was nonspecific but often seen with hepatic steatosis.    12/01/2020 patient seen in hematology/oncology and noted to have thrombocytopenia with last platelets 07/30/2020 noted at 164.  Hemoglobin at that time 13.9.  That time noted that the thrombocytopenia was of unclear etiology.  Discussed the possible etiologies could include liver disease or nutritional deficiency.  Hep C and HIV testing had been negative.  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    12/01/2020 CMP with normal liver enzymes.    Today, the patient presents to clinic accompanied by an interpreter and her daughter.  They tell me that patient would like to have the EGD and colonoscopy as recommended at last visit.  She was cleared by cardiology.  She continues to tell me that she sees some bright red blood with bowel movements off and on, she  saw some this morning and the time before that was a couple weeks ago.  Denies any abdominal pain.    Also asked questions in regards to new finding of fatty liver from ultrasound ordered at last visit.    Patient's main complaint today is that she is fatigued.    Denies fever, chills, weight loss or symptoms that awaken her from sleep.  Past Medical History:  Diagnosis Date   Chest pain    Gastritis    GERD (gastroesophageal reflux disease)    SOB (shortness of breath)     Past Surgical History:  Procedure Laterality Date   TUBAL LIGATION  1994    Current Outpatient Medications  Medication Sig Dispense Refill   acetaminophen (TYLENOL) 325 MG tablet Take 650 mg by mouth every 6 (six) hours as needed.     albuterol (VENTOLIN HFA) 108 (90 Base) MCG/ACT inhaler Inhale 1-2 puffs into the lungs every 6 (six) hours as needed for wheezing or shortness of breath. 90 g 0   fluticasone (FLONASE) 50 MCG/ACT nasal spray Place 1 spray into both nostrils daily. 9.9 mL 2   nitroGLYCERIN (NITROSTAT) 0.4 MG SL tablet Place 1 tablet (0.4 mg total) under the tongue every 5 (five) minutes as needed for chest pain. 30 tablet 0   omeprazole (PRILOSEC) 40 MG capsule Take 1 capsule (40 mg total) by mouth daily. 30 capsule 1   No current facility-administered medications for this visit.    Allergies  as of 03/07/2021   (No Known Allergies)    Family History  Problem Relation Age of Onset   Gestational diabetes Daughter    Alcohol abuse Brother    Cerebrovascular Accident Brother        "blood clot on brain"  [place] now.   Breast cancer Neg Hx     Social History   Socioeconomic History   Marital status: Widowed    Spouse name: Eddie Dibbles Adup   Number of children: 8   Years of education: 8   Highest education level: Not on file  Occupational History   Occupation: Housewife  Tobacco Use   Smoking status: Never   Smokeless tobacco: Never  Vaping Use   Vaping Use: Never used  Substance and Sexual  Activity   Alcohol use: No   Drug use: No   Sexual activity: Not Currently    Comment: married  Other Topics Concern   Not on file  Social History Narrative   Originally from [place]   [place]   Came to [place] In [place] with her husband and one daughter.   Husband with hepatic carcinoma, for which he has been treated past 3 years.     She has support to give her time off.   Son who interprets takes her husband for chemo twice weekly.   Social Determinants of Health   Financial Resource Strain: Not on file  Food Insecurity: Not on file  Transportation Needs: Not on file  Physical Activity: Not on file  Stress: Not on file  Social Connections: Not on file  Intimate Partner Violence: Not on file    Review of Systems:    Constitutional: No weight loss, fever or chills Cardiovascular: No chest pain  Respiratory: No SOB Gastrointestinal: See HPI and otherwise negative   Physical Exam:  Vital signs: BP 122/60 (BP Location: Left Arm, Patient Position: Sitting, Cuff Size: Normal)   Pulse 80   Ht 4\' 11"  (1.499 m) Comment: height measured without shoes  Wt 126 lb (57.2 kg)   LMP 01/12/2012   BMI 25.45 kg/m    Constitutional:   Pleasant [place] female appears to be in NAD, Well developed, Well nourished, alert and cooperative Respiratory: Respirations even and unlabored. Lungs clear to auscultation bilaterally.   No wheezes, crackles, or rhonchi.  Cardiovascular: Normal S1, S2. No MRG. Regular rate and rhythm. No peripheral edema, cyanosis or pallor.  Gastrointestinal:  Soft, nondistended, nontender. No rebound or guarding. Normal bowel sounds. No appreciable masses or hepatomegaly. Rectal:  Not performed.  Skin: lump-felt behind left arm- non-tender Psychiatric: Demonstrates good judgement and reason without abnormal affect or behaviors.  RELEVANT LABS AND IMAGING: CBC    Component Value Date/Time   WBC 6.6 12/01/2020 1433   WBC 7.8 07/23/2010 0001   RBC 4.81  12/01/2020 1433   HGB 14.0 12/01/2020 1433   HGB 14.2 07/20/2020 1635   HCT 43.2 12/01/2020 1433   HCT 43.8 07/20/2020 1635   PLT 124 (L) 12/01/2020 1433   PLT 97 (LL) 07/20/2020 1635   MCV 89.8 12/01/2020 1433   MCV 91 07/20/2020 1635   MCH 29.1 12/01/2020 1433   MCHC 32.4 12/01/2020 1433   RDW 13.4 12/01/2020 1433   RDW 12.0 07/20/2020 1635   LYMPHSABS 2.8 12/01/2020 1433   LYMPHSABS 2.4 04/30/2020 1430   MONOABS 0.5 12/01/2020 1433   EOSABS 0.3 12/01/2020 1433   EOSABS 0.3 04/30/2020 1430   BASOSABS 0.0 12/01/2020 1433   BASOSABS 0.0  04/30/2020 1430    CMP     Component Value Date/Time   NA 144 12/01/2020 1433   NA 142 04/30/2020 1430   K 4.2 12/01/2020 1433   CL 105 12/01/2020 1433   CO2 28 12/01/2020 1433   GLUCOSE 134 (H) 12/01/2020 1433   BUN 23 12/01/2020 1433   BUN 18 04/30/2020 1430   CREATININE 0.96 12/01/2020 1433   CREATININE 0.47 (L) 12/17/2011 1124   CALCIUM 9.8 12/01/2020 1433   PROT 7.8 12/01/2020 1433   PROT 7.1 04/30/2020 1430   ALBUMIN 4.2 12/01/2020 1433   ALBUMIN 4.6 04/30/2020 1430   AST 15 12/01/2020 1433   ALT 10 12/01/2020 1433   ALKPHOS 53 12/01/2020 1433   BILITOT 0.4 12/01/2020 1433   GFRNONAA >60 12/01/2020 1433   GFRAA 108 04/30/2020 1430    Assessment: 1.  Rectal bleeding: Patient has continued with bright red blood per rectum typically with a bowel movement off and on; consider hemorrhoids versus polyp versus other 2.  GERD: With some left upper quadrant pain, previous EGD in 2013 showed gastritis but no H. Pylori; consider ongoing gastritis the patient is increased risk for gastric cancer secondary to Asian heritage 3.  History of thrombocytopenia: Follows with Dr. Lorenso Courier, so far work-up has been negative, uncertain cause, seems to vary up and down 4.  Intermittent chest pain associated dizziness/shortness of breath: Was complaining of this at last visit, has since followed with cardiologist and underwent stress test and EKG all of  which were normal  Plan: 1.  Patient has been cleared by cardiology since being seen last.  Martin Majestic ahead and scheduled her for diagnostic EGD and colonoscopy with Dr. Hilarie [facility]rickson in [facility].  Did provide the patient a detailed list of risks for the procedures and she agrees to proceed. Patient is appropriate for endoscopic procedure(s) in the ambulatory ([place]) setting.  2.  Increased patient's Omeprazole to 40 mg twice daily, 30-60 minutes before breakfast and dinner.  Prescribed #60 with 5 refills. 3.  Discussed with patient that she has fatty liver but I do not believe this is contributing to her thrombocytopenia or her fatigue.  She may want to continue to follow with her PCP about this.  Did try to reassure her that the hematologist has not found any anemia and the cardiologist work-up was negative, so both of these reasons do not explain her fatigue either.  It could just be that she is getting older.  Did discuss fatty liver and recommendations for healthy diet and slow and steady weight loss. 4.  Briefly discussed a left mass behind the patient's shoulder, this is movable and nontender, discussed possibility of lipoma but would recommend she follow-up with her PCP for more imaging in this area.  Is apparently up-to-date with mammograms. 5.  Patient to follow in clinic per recommendations from Dr. Hilarie Fredrickson after time of procedures.  Ellouise Newer, PA-C [facility] 03/07/2021, 1:26 PM  Cc: Lattie Haw, MD

## 2021-03-07 NOTE — Patient Instructions (Addendum)
Chng ti ? g?i cc lo?i thu?c sau ??n hi?u thu?c c?a b?n ?? b?n c th? l?y m?t cch thu?n ti?n: Omeprazol 40 mg x 2 l?n/ngy 30-60 pht tr??c b?a ?n sng v t?i.  G?p bc s? ch?m [place] chnh v? kh?i sau cnh tay.  B?n ? ???c ln l?ch n?i soi v soi ??i trng. Vui lng lm theo cc h??ng d?n b?ng v?n b?n ???c cung c?p cho b?n t?i chuy?n th?m c?a b?n ngy hm nay. Vui lng nh?n ?? dng chu?n b? c?a b?n t?i hi?u thu?c trong vng 1-3 ngy t?i. N?u b?n s? d?ng ?ng ht (th?m ch ch? khi c?n thi?t), vui lng mang theo chng vo ngy lm th? thu?t. __________________________________________________________________  We have sent the following medications to your pharmacy for you to pick up at your convenience: Omeprazole 40 mg twice daily 30-60 minutes before breakfast and dinner.  See primary care doctor about mass behind arm.   You have been scheduled for an endoscopy and colonoscopy. Please follow the written instructions given to you at your visit today. Please pick up your prep supplies at the pharmacy within the next 1-3 days. If you use inhalers (even only as needed), please bring them with you on the day of your procedure.

## 2021-03-07 NOTE — Progress Notes (Signed)
Addendum: Reviewed and agree with assessment and management plan. Mckade Gurka M, MD  

## 2021-03-08 ENCOUNTER — Telehealth: Payer: Self-pay

## 2021-03-08 MED ORDER — ALBUTEROL SULFATE HFA 108 (90 BASE) MCG/ACT IN AERS: 1.0000 | INHALATION_SPRAY | Freq: Four times a day (QID) | RESPIRATORY_TRACT | 0 refills | Status: DC | PRN

## 2021-03-08 NOTE — Telephone Encounter (Signed)
Albuterol refilled. Declined refill for nitroglycerin (prescribed in May, had negative myoview).  Nursing- please call patient and help to schedule for visit in December/January.

## 2021-03-09 ENCOUNTER — Other Ambulatory Visit: Payer: Self-pay | Admitting: Family Medicine

## 2021-03-14 ENCOUNTER — Ambulatory Visit (AMBULATORY_SURGERY_CENTER): Payer: 59 | Admitting: Internal Medicine

## 2021-03-14 ENCOUNTER — Encounter: Payer: Self-pay | Admitting: Internal Medicine

## 2021-03-14 VITALS — BP 99/55 | HR 65 | Temp 98.6°F | Resp 16 | Ht 59.0 in | Wt 126.0 lb

## 2021-03-14 DIAGNOSIS — R1012 Left upper quadrant pain: Secondary | ICD-10-CM

## 2021-03-14 DIAGNOSIS — K625 Hemorrhage of anus and rectum: Secondary | ICD-10-CM | POA: Diagnosis not present

## 2021-03-14 DIAGNOSIS — K5730 Diverticulosis of large intestine without perforation or abscess without bleeding: Secondary | ICD-10-CM | POA: Diagnosis not present

## 2021-03-14 DIAGNOSIS — K648 Other hemorrhoids: Secondary | ICD-10-CM

## 2021-03-14 DIAGNOSIS — D122 Benign neoplasm of ascending colon: Secondary | ICD-10-CM

## 2021-03-14 DIAGNOSIS — D123 Benign neoplasm of transverse colon: Secondary | ICD-10-CM

## 2021-03-14 DIAGNOSIS — D125 Benign neoplasm of sigmoid colon: Secondary | ICD-10-CM | POA: Diagnosis not present

## 2021-03-14 DIAGNOSIS — K219 Gastro-esophageal reflux disease without esophagitis: Secondary | ICD-10-CM

## 2021-03-14 MED ORDER — SODIUM CHLORIDE 0.9 % IV SOLN: 500.0000 mL | Freq: Once | INTRAVENOUS | Status: DC

## 2021-03-14 NOTE — Op Note (Signed)
[facility] Patient Name: Beth Brooks Procedure Date: 03/14/2021 2:43 PM MRN: [id number] Endoscopist: Jerene Bears , MD Age: 65 Referring MD:  Date of Birth: 1956/02/14 Gender: Female Account #: [id number] Procedure:                Colonoscopy Indications:              Rectal bleeding, last colonoscopy for screening                            normal in Nov 2013 Medicines:                Monitored Anesthesia Care Procedure:                Pre-Anesthesia Assessment:                           - Prior to the procedure, a History and Physical                            was performed, and patient medications and                            allergies were reviewed. The patient's tolerance of                            previous anesthesia was also reviewed. The risks                            and benefits of the procedure and the sedation                            options and risks were discussed with the patient.                            All questions were answered, and informed consent                            was obtained. Prior Anticoagulants: The patient has                            taken no previous anticoagulant or antiplatelet                            agents. ASA Grade Assessment: II - A patient with                            mild systemic disease. After reviewing the risks                            and benefits, the patient was deemed in                            satisfactory condition to undergo the procedure.  After obtaining informed consent, the colonoscope                            was passed under direct vision. Throughout the                            procedure, the patient's blood pressure, pulse, and                            oxygen saturations were monitored continuously. The                            0405 PCF-H190TL Slim SB Colonoscope was introduced                            through the anus and advanced to the  terminal                            ileum. The colonoscopy was performed without                            difficulty. The patient tolerated the procedure                            well. The quality of the bowel preparation was                            excellent. The terminal ileum, ileocecal valve,                            appendiceal orifice, and rectum were photographed. Scope In: 2:53:14 PM Scope Out: 3:05:27 PM Scope Withdrawal Time: 0 hours 10 minutes 42 seconds  Total Procedure Duration: 0 hours 12 minutes 13 seconds  Findings:                 The digital rectal exam was normal.                           The terminal ileum appeared normal.                           Two sessile polyps were found in the ascending                            colon. The polyps were 3 to 4 mm in size. These                            polyps were removed with a cold snare. Resection                            and retrieval were complete.                           A 5 mm polyp was found in the transverse colon.  The                            polyp was sessile. The polyp was removed with a                            cold snare. Resection and retrieval were complete.                           A 5 mm polyp was found in the sigmoid colon. The                            polyp was sessile. The polyp was removed with a                            cold snare. Resection and retrieval were complete.                           Multiple small-mouthed diverticula were found in                            the sigmoid colon and descending colon.                           Internal hemorrhoids were found during                            retroflexion. The hemorrhoids were medium-sized. Complications:            No immediate complications. Estimated Blood Loss:     Estimated blood loss was minimal. Impression:               - The examined portion of the ileum was normal.                           - Two 3 to 4 mm polyps  in the ascending colon,                            removed with a cold snare. Resected and retrieved.                           - One 5 mm polyp in the transverse colon, removed                            with a cold snare. Resected and retrieved.                           - One 5 mm polyp in the sigmoid colon, removed with                            a cold snare. Resected and retrieved.                           - Diverticulosis in the  sigmoid colon and in the                            descending colon.                           - Internal hemorrhoids. Source of red blood per                            rectum. Recommendation:           - Patient has a contact number available for                            emergencies. The signs and symptoms of potential                            delayed complications were discussed with the                            patient. Return to normal activities tomorrow.                            Written discharge instructions were provided to the                            patient.                           - Resume previous diet.                           - Continue present medications.                           - Await pathology results.                           - If hemorrhoidal bleeding continues to be a                            problem, in-office hemorrhoidal banding would be a                            good treatment option/solution.                           - Repeat colonoscopy is recommended. The                            colonoscopy date will be determined after pathology                            results from today's exam become available for                            review. Jerene Bears, MD 03/14/2021 3:10:36 PM  This report has been signed electronically.

## 2021-03-14 NOTE — Patient Instructions (Signed)
Please read handouts provided. Continue present medications. Await pathology results.     YOU HAD AN ENDOSCOPIC PROCEDURE TODAY AT THE [facility]:   Refer to the procedure report that was given to you for any specific questions about what was found during the examination.  If the procedure report does not answer your questions, please call your gastroenterologist to clarify.  If you requested that your care partner not be given the details of your procedure findings, then the procedure report has been included in a sealed envelope for you to review at your convenience later.  YOU SHOULD EXPECT: Some feelings of bloating in the abdomen. Passage of more gas than usual.  Walking can help get rid of the air that was put into your GI tract during the procedure and reduce the bloating. If you had a lower endoscopy (such as a colonoscopy or flexible sigmoidoscopy) you may notice spotting of blood in your stool or on the toilet paper. If you underwent a bowel prep for your procedure, you may not have a normal bowel movement for a few days.  Please Note:  You might notice some irritation and congestion in your nose or some drainage.  This is from the oxygen used during your procedure.  There is no need for concern and it should clear up in a day or so.  SYMPTOMS TO REPORT IMMEDIATELY:   Following lower endoscopy (colonoscopy or flexible sigmoidoscopy):  Excessive amounts of blood in the stool  Significant tenderness or worsening of abdominal pains  Swelling of the abdomen that is new, acute  Fever of 100F or higher   Following upper endoscopy (EGD)  Vomiting of blood or coffee ground material  New chest pain or pain under the shoulder blades  Painful or persistently difficult swallowing  New shortness of breath  Fever of 100F or higher  Black, tarry-looking stools  For urgent or emergent issues, a gastroenterologist can be reached at any hour by calling [phone]. Do not  use MyChart messaging for urgent concerns.    DIET:  We do recommend a small meal at first, but then you may proceed to your regular diet.  Drink plenty of fluids but you should avoid alcoholic beverages for 24 hours.  ACTIVITY:  You should plan to take it easy for the rest of today and you should NOT DRIVE or use heavy machinery until tomorrow (because of the sedation medicines used during the test).    FOLLOW UP: Our staff will call the number listed on your records 48-72 hours following your procedure to check on you and address any questions or concerns that you may have regarding the information given to you following your procedure. If we do not reach you, we will leave a message.  We will attempt to reach you two times.  During this call, we will ask if you have developed any symptoms of COVID 19. If you develop any symptoms (ie: fever, flu-like symptoms, shortness of breath, cough etc.) before then, please call [phone].  If you test positive for Covid 19 in the 2 weeks post procedure, please call and report this information to us.    If any biopsies were taken you will be contacted by phone or by letter within the next 1-3 weeks.  Please call us at [phone] if you have not heard about the biopsies in 3 weeks.    SIGNATURES/CONFIDENTIALITY: You and/or your care partner have signed paperwork which will be entered into your electronic medical   record.  These signatures attest to the fact that that the information above on your After Visit Summary has been reviewed and is understood.  Full responsibility of the confidentiality of this discharge information lies with you and/or your care-partner. 

## 2021-03-14 NOTE — Progress Notes (Signed)
Patient presenting for upper endoscopy and colonoscopy.  Seen in the office recently on 03/07/2021.  See that note for details.  No significant changes in medical history since that time  EGD to be performed for GERD and left upper quadrant pain.  Colonoscopy to be performed for rectal bleeding.  Patient had normal colonoscopy in November 2013.  Patient remains appropriate for outpatient upper and lower endoscopy in the [place] today.

## 2021-03-14 NOTE — Progress Notes (Signed)
Called to room to assist during endoscopic procedure.  Patient ID and intended procedure confirmed with present staff. Received instructions for my participation in the procedure from the performing physician.  

## 2021-03-14 NOTE — Op Note (Signed)
[facility] Patient Name: Beth Brooks Procedure Date: 03/14/2021 2:45 PM MRN: [id number] Endoscopist: Jerene Bears , MD Age: 65 Referring MD:  Date of Birth: [birth year] Gender: Female Account #: [id number] Procedure:                Upper GI endoscopy Indications:              Abdominal pain in the left upper quadrant,                            Suspected gastro-esophageal reflux disease Medicines:                Monitored Anesthesia Care Procedure:                Pre-Anesthesia Assessment:                           - Prior to the procedure, a History and Physical                            was performed, and patient medications and                            allergies were reviewed. The patient's tolerance of                            previous anesthesia was also reviewed. The risks                            and benefits of the procedure and the sedation                            options and risks were discussed with the patient.                            All questions were answered, and informed consent                            was obtained. Prior Anticoagulants: The patient has                            taken no previous anticoagulant or antiplatelet                            agents. ASA Grade Assessment: II - A patient with                            mild systemic disease. After reviewing the risks                            and benefits, the patient was deemed in                            satisfactory condition to undergo the procedure.  After obtaining informed consent, the endoscope was                            passed under direct vision. Throughout the                            procedure, the patient's blood pressure, pulse, and                            oxygen saturations were monitored continuously. The                            GIF [id number] #[id number] was introduced through the                            mouth, and advanced to  the second part of duodenum.                            The upper GI endoscopy was accomplished without                            difficulty. The patient tolerated the procedure                            well. Scope In: Scope Out: Findings:                 The esophagus was normal.                           The stomach was normal.                           The examined duodenum was normal. Complications:            No immediate complications. Estimated Blood Loss:     Estimated blood loss: none. Impression:               - Normal esophagus.                           - Normal stomach.                           - Normal examined duodenum.                           - No specimens collected. Recommendation:           - Patient has a contact number available for                            emergencies. The signs and symptoms of potential                            delayed complications were discussed with the  patient. Return to normal activities tomorrow.                            Written discharge instructions were provided to the                            patient.                           - Resume previous diet.                           - Continue present medications.                           - See the other procedure note for documentation of                            additional recommendations. Jerene Bears, MD 03/14/2021 3:07:09 PM This report has been signed electronically.

## 2021-03-14 NOTE — Progress Notes (Signed)
Pt's states no medical or surgical changes since previsit or office visit. 

## 2021-03-14 NOTE — Progress Notes (Signed)
Report to PACU, RN, vss, BBS= Clear.  

## 2021-03-16 ENCOUNTER — Telehealth: Payer: Self-pay

## 2021-03-16 NOTE — Telephone Encounter (Signed)
°  Follow up Call-  Call back number 03/14/2021  Post procedure Call Back phone  # [phone]  Permission to leave phone message Yes  Some recent data might be hidden     Patient questions:  Do you have a fever, pain , or abdominal swelling? No. Pain Score  0 *  Have you tolerated food without any problems? Yes.    Have you been able to return to your normal activities? Yes.    Do you have any questions about your discharge instructions: Diet   No. Medications  No. Follow up visit  No.  Do you have questions or concerns about your Care? No.  Actions: * If pain score is 4 or above: No action needed, pain <4.

## 2021-03-17 ENCOUNTER — Encounter: Payer: Self-pay | Admitting: Internal Medicine

## 2021-03-31 ENCOUNTER — Other Ambulatory Visit: Payer: Self-pay | Admitting: *Deleted

## 2021-04-04 MED ORDER — ALBUTEROL SULFATE HFA 108 (90 BASE) MCG/ACT IN AERS: 1.0000 | INHALATION_SPRAY | Freq: Four times a day (QID) | RESPIRATORY_TRACT | 0 refills | Status: DC | PRN

## 2021-04-06 ENCOUNTER — Other Ambulatory Visit: Payer: Self-pay

## 2021-04-06 ENCOUNTER — Ambulatory Visit: Payer: Self-pay | Admitting: Registered Nurse

## 2021-04-06 ENCOUNTER — Ambulatory Visit (INDEPENDENT_AMBULATORY_CARE_PROVIDER_SITE_OTHER): Payer: 59 | Admitting: Family Medicine

## 2021-04-06 ENCOUNTER — Telehealth: Payer: Self-pay | Admitting: *Deleted

## 2021-04-06 VITALS — BP 102/57 | HR 70 | Ht 59.0 in | Wt 125.8 lb

## 2021-04-06 DIAGNOSIS — K219 Gastro-esophageal reflux disease without esophagitis: Secondary | ICD-10-CM | POA: Diagnosis not present

## 2021-04-06 DIAGNOSIS — R222 Localized swelling, mass and lump, trunk: Secondary | ICD-10-CM

## 2021-04-06 DIAGNOSIS — M7989 Other specified soft tissue disorders: Secondary | ICD-10-CM | POA: Diagnosis not present

## 2021-04-06 MED ORDER — FAMOTIDINE 20 MG PO TABS: 20.0000 mg | ORAL_TABLET | Freq: Two times a day (BID) | ORAL | 0 refills | Status: DC

## 2021-04-06 NOTE — Telephone Encounter (Signed)
Pt in office today on ATC.Klyn Kroening Zimmerman Rumple, CMA

## 2021-04-06 NOTE — Patient Instructions (Signed)
Thank you for coming to see me today. It was a pleasure.   Continue Omeprazole 40 mg twice a day Start Pepcid 20 mg twice a day Follow up with [facility]  Ultrasound of left chest lump.  Will notify you of results and plan for follow up with PCP   If you have any questions or concerns, please do not hesitate to call the office at [phone].  Best,   Carollee Leitz, MD

## 2021-04-06 NOTE — Progress Notes (Signed)
° ° °  SUBJECTIVE:   CHIEF COMPLAINT / HPI: heartburn and left lump  Continues to have heartburn.  Evaluated by GI, EGD showed gastritis.  Patient taking Omeprazole 20 mg BID, however has not been helpful.  Daughter reports that insurance will not cover twice daily dosing.  Reports pain is worse at night and has been ongoing for sometime.  Denies any nausea, vomiting, diarrhea or constipation.  No bloody stool or hematuria.  Lump behind left arm.  Has been there for some time and gotten bigger.  Denies any pain, fevers or swelling. Recent mammogram negative for malignancy.  Recent chest xray negative for pulmonary nodules.  PERTINENT  PMH / PSH:  GERD\  OBJECTIVE:   BP (!) 102/57    Pulse 70    Ht 4\' 11"  (1.499 m)    Wt 125 lb 12.8 oz (57.1 kg)    LMP 01/12/2012    SpO2 99%    BMI 25.41 kg/m    General: Alert, no acute distress Cardio: Normal S1 and S2, RRR, no r/m/g Pulm: CTAB, normal work of breathing Abdomen: Bowel sounds normal. Abdomen soft and non-tender.  Derm: Left posterolateral chest wall soft, non tender, mobile mass  ASSESSMENT/PLAN:   GERD (gastroesophageal reflux disease) Continue Omeprazole 20 mg BID Start Pepcid 20 mg BID Follow up with GI if symptoms do not improve    Mass of chest wall, left Soft, non tender mobile mass likely lipoma. -u/s chest soft tissue to confirm  -Patient would like removal if possible. -Follow up with results, likely will need Derm or Surgical consult for removal.     Carollee Leitz, MD [facility]

## 2021-04-06 NOTE — Telephone Encounter (Signed)
Prior authorization submitted  via Covermymeds for Omeprazole 40 mg twice daily

## 2021-04-07 NOTE — Telephone Encounter (Signed)
PA approved. Faxed approval to [employer].

## 2021-04-10 ENCOUNTER — Encounter: Payer: Self-pay | Admitting: Family Medicine

## 2021-04-10 DIAGNOSIS — R222 Localized swelling, mass and lump, trunk: Secondary | ICD-10-CM | POA: Insufficient documentation

## 2021-04-10 NOTE — Assessment & Plan Note (Signed)
Continue Omeprazole 20 mg BID Start Pepcid 20 mg BID Follow up with GI if symptoms do not improve

## 2021-04-10 NOTE — Assessment & Plan Note (Addendum)
Soft, non tender mobile mass likely lipoma. -u/s chest soft tissue to confirm  -Patient would like removal if possible. -Follow up with results, likely will need Derm or Surgical consult for removal.

## 2021-04-11 ENCOUNTER — Ambulatory Visit (HOSPITAL_COMMUNITY): Payer: 59

## 2021-04-12 ENCOUNTER — Ambulatory Visit (HOSPITAL_COMMUNITY)
Admission: RE | Admit: 2021-04-12 | Discharge: 2021-04-12 | Disposition: A | Discharge status: Home / Self Care | Payer: 59 | Source: Ambulatory Visit | Attending: Family Medicine | Admitting: Family Medicine

## 2021-04-12 ENCOUNTER — Other Ambulatory Visit: Payer: Self-pay

## 2021-04-12 DIAGNOSIS — M7989 Other specified soft tissue disorders: Secondary | ICD-10-CM | POA: Insufficient documentation

## 2021-04-19 ENCOUNTER — Other Ambulatory Visit: Payer: Self-pay | Admitting: Family Medicine

## 2021-05-02 ENCOUNTER — Other Ambulatory Visit: Payer: Self-pay | Admitting: Family Medicine

## 2021-05-02 DIAGNOSIS — D171 Benign lipomatous neoplasm of skin and subcutaneous tissue of trunk: Secondary | ICD-10-CM

## 2021-05-18 ENCOUNTER — Encounter: Payer: Self-pay | Admitting: Surgery

## 2021-05-18 ENCOUNTER — Other Ambulatory Visit: Payer: Self-pay

## 2021-05-18 ENCOUNTER — Ambulatory Visit (INDEPENDENT_AMBULATORY_CARE_PROVIDER_SITE_OTHER): Payer: 59 | Admitting: Surgery

## 2021-05-18 VITALS — BP 109/73 | HR 63 | Temp 98.3°F | Ht 60.0 in | Wt 123.0 lb

## 2021-05-18 DIAGNOSIS — D171 Benign lipomatous neoplasm of skin and subcutaneous tissue of trunk: Secondary | ICD-10-CM | POA: Diagnosis not present

## 2021-05-18 NOTE — Progress Notes (Signed)
05/18/2021  Reason for Visit:  Lipoma of left chest wall  Requesting Provider:  Carollee Leitz, MD  History of Present Illness: Beth Brooks is a 66 y.o. female presenting for evaluation of a left chest wall mass.  The patient is seen today with a [place] interpreter.  The patient reports she is unsure of exactly the duration of this mass but thinks about a few months.  Reports the mass is in the left upper chest wall.  Denies any growth of the mass, and denies any particular pain from the mass.  Has not had any issues with erythema, induration, or drainage from the area.  Denies any trauma there.  Denies any difficulty with range of motion of her left arm/shoulder.  Her main concern is the presence of the mass and worried that it may be something serious.  She had an ultrasound of the area on 04/12/21 which showed a 4 x 4.1 x 1.4 cm mass that was likely to be a lipoma.  Past Medical History: Past Medical History:  Diagnosis Date   Allergy    Chest pain    Gastritis    GERD (gastroesophageal reflux disease)    SOB (shortness of breath)    Thrombocytopenia (HCC)      Past Surgical History: Past Surgical History:  Procedure Laterality Date   TUBAL LIGATION  1994    Home Medications: Prior to Admission medications   Medication Sig Start Date End Date Taking? Authorizing Provider  acetaminophen (TYLENOL) 325 MG tablet Take 650 mg by mouth every 6 (six) hours as needed.   Yes [provider]  albuterol (VENTOLIN HFA) 108 (90 Base) MCG/ACT inhaler Inhale 1-2 puffs into the lungs every 6 (six) hours as needed for wheezing or shortness of breath. 04/04/21  Yes Lattie Haw, MD  famotidine (PEPCID) 20 MG tablet Take 20 mg by mouth 2 (two) times daily.   Yes [provider]  fluticasone (FLONASE) 50 MCG/ACT nasal spray Place 1 spray into both nostrils daily. 02/09/21  Yes Marney Setting, NP  nitroGLYCERIN (NITROSTAT) 0.4 MG SL tablet Place 1 tablet (0.4 mg total)  under the tongue every 5 (five) minutes as needed for chest pain. Patient not taking: Reported on 05/18/2021 08/05/20   Wilber Oliphant, MD    Allergies: No Known Allergies  Social History:  reports that she has never smoked. She has never been exposed to tobacco smoke. She has never used smokeless tobacco. She reports that she does not drink alcohol and does not use drugs.   Family History: Family History  Problem Relation Age of Onset   Gestational diabetes Daughter    Alcohol abuse Brother    Cerebrovascular Accident Brother        "blood clot on brain"  [place] now.   Breast cancer Neg Hx     Review of Systems: Review of Systems  Constitutional:  Negative for chills and fever.  HENT:  Negative for hearing loss.   Respiratory:  Negative for shortness of breath.   Cardiovascular:  Negative for chest pain.  Gastrointestinal:  Negative for abdominal pain, nausea and vomiting.  Genitourinary:  Negative for dysuria.  Musculoskeletal:  Negative for myalgias.  Skin:  Negative for rash.       Left chest wall mass  Neurological:  Negative for dizziness.  Psychiatric/Behavioral:  Negative for depression.    Physical Exam BP 109/73    Pulse 63    Temp 98.3 F (36.8 C)    Ht  5' (1.524 m)    Wt 123 lb (55.8 kg)    LMP 01/12/2012    SpO2 98%    BMI 24.02 kg/m  CONSTITUTIONAL: No acute distress, well nourished. HEENT:  Normocephalic, atraumatic, extraocular motion intact. NECK: Trachea is midline, and there is no jugular venous distension.  RESPIRATORY:  Lungs are clear, and breath sounds are equal bilaterally. Normal respiratory effort without pathologic use of accessory muscles. CARDIOVASCULAR: Heart is regular without murmurs, gallops, or rubs. MUSCULOSKELETAL:  Normal muscle strength and tone in all four extremities.  No peripheral edema or cyanosis.  No issues with range of motion of her left shoulder or arm. SKIN: Patient has an approximately 4 cm mass in the posterior axillary line,  about 5 cm below the apex of the axilla.  It is soft, mobile, non-tender to palpation.  It does move with left shoulder/arm movement.  NEUROLOGIC:  Motor and sensation is grossly normal.  Cranial nerves are grossly intact. PSYCH:  Alert and oriented to person, place and time. Affect is normal.  Laboratory Analysis: No results found for this or any previous visit (from the past 24 hour(s)).  Imaging: U/S left chest 04/12/21: FINDINGS: Targeted sonographic images of the soft tissues of the left upper back in the region of the clinical concern was performed.   There is a 4.0 x 1.4 x 4.1 cm ovoid lesion in the subcutaneous soft tissues of the left upper back demonstrating a similar echogenicity as the surrounding tissue. No significant internal vascularity noted. This most likely represents a lipoma. Other etiologies are not excluded. If there is continued growth or other clinical findings of concern, further evaluation with MRI is advised.   No fluid collection.   IMPRESSION: Probable lipoma.  Clinical correlation is recommended.  Assessment and Plan: This is a 66 y.o. female with a left chest wall lipoma.  --The mass is located in the upper left lateral chest, at the level of the posterior axillary line.  It is soft, non-tender, mobile, consistent with a lipoma.  Discussed with the patient what a lipoma is and that it is a benign mass and there are no suspicious findings to indicate anything more worrisome.   --Offered the patient the option for excision of the mass, which I think could be done in the office based on size, vs watchful waiting and monitoring for any changes.  Discussed pros and cons and what symptoms or changes to watch out for.  After further discussion, she has opted for watchful waiting.  Reassured her that this is most likely a lipoma which is again a benign mass. --Return precautions given. --Follow up with me as needed.  I spent 60 minutes dedicated to the care of  this patient on the date of this encounter to include pre-visit review of records, face-to-face time with the patient discussing diagnosis and management, and any post-visit coordination of care.   Melvyn Neth, [facility]

## 2021-05-18 NOTE — Patient Instructions (Signed)
Follow-up with our office as needed.  Please call and ask to speak with a nurse if you develop questions or concerns.   Lipoma A lipoma is a noncancerous (benign) tumor that is made up of fat cells. This is a very common type of soft-tissue growth. Lipomas are usually found under the skin (subcutaneous). They may occur in any tissue of the body that contains fat. Common areas for lipomas to appear include the back, arms, shoulders, buttocks, and thighs. Lipomas grow slowly, and they are usually painless. Most lipomas do not cause problems and do not require treatment. What are the causes? The cause of this condition is not known. What increases the risk? You are more likely to develop this condition if: You are 64-60 years old. You have a family history of lipomas. What are the signs or symptoms? A lipoma usually appears as a small, round bump under the skin. In most cases, the lump will: Feel soft or rubbery. Not cause pain or other symptoms. However, if a lipoma is located in an area where it pushes on nerves, it can become painful or cause other symptoms. How is this diagnosed? A lipoma can usually be diagnosed with a physical exam. You may also have tests to confirm the diagnosis and to rule out other conditions. Tests may include: Imaging tests, such as a CT scan or an MRI. Removal of a tissue sample to be looked at under a microscope (biopsy). How is this treated? Treatment for this condition depends on the size of the lipoma and whether it is causing any symptoms. For small lipomas that are not causing problems, no treatment is needed. If a lipoma is bigger or it causes problems, surgery may be done to remove the lipoma. Lipomas can also be removed to improve appearance. Most often, the procedure is done after applying a medicine that numbs the area (local anesthetic). Liposuction may be done to reduce the size of the lipoma before it is removed through surgery, or it may be done to  remove the lipoma. Lipomas are removed with this method in order to limit incision size and scarring. A liposuction tube is inserted through a small incision into the lipoma, and the contents of the lipoma are removed through the tube with suction. Follow these instructions at home: Watch your lipoma for any changes. Keep all follow-up visits as told by your health care provider. This is important. Contact a health care provider if: Your lipoma becomes larger or hard. Your lipoma becomes painful, red, or increasingly swollen. These could be signs of infection or a more serious condition. Get help right away if: You develop tingling or numbness in an area near the lipoma. This could indicate that the lipoma is causing nerve damage. Summary A lipoma is a noncancerous tumor that is made up of fat cells. Most lipomas do not cause problems and do not require treatment. If a lipoma is bigger or it causes problems, surgery may be done to remove the lipoma. Contact a health care provider if your lipoma becomes larger or hard, or if it becomes painful, red, or increasingly swollen. Pain, redness, and swelling could be signs of infection or a more serious condition. This information is not intended to replace advice given to you by your health care provider. Make sure you discuss any questions you have with your health care provider. Document Revised: 10/28/2018 Document Reviewed: 10/28/2018 Elsevier Patient Education  [place].

## 2021-05-21 ENCOUNTER — Ambulatory Visit (HOSPITAL_COMMUNITY)
Admission: EM | Admit: 2021-05-21 | Discharge: 2021-05-21 | Disposition: A | Discharge status: Home / Self Care | Payer: 59 | Attending: Family Medicine | Admitting: Family Medicine

## 2021-05-21 ENCOUNTER — Other Ambulatory Visit: Payer: Self-pay

## 2021-05-21 DIAGNOSIS — K219 Gastro-esophageal reflux disease without esophagitis: Secondary | ICD-10-CM

## 2021-05-21 LAB — CBC WITH DIFFERENTIAL/PLATELET
Abs Immature Granulocytes: 0.02 10*3/uL (ref 0.00–0.07)
Basophils Absolute: 0 10*3/uL (ref 0.0–0.1)
Basophils Relative: 0 %
Eosinophils Absolute: 0.3 10*3/uL (ref 0.0–0.5)
Eosinophils Relative: 5 %
HCT: 44.2 % (ref 36.0–46.0)
Hemoglobin: 14.6 g/dL (ref 12.0–15.0)
Immature Granulocytes: 0 %
Lymphocytes Relative: 33 %
Lymphs Abs: 2 10*3/uL (ref 0.7–4.0)
MCH: 30.1 pg (ref 26.0–34.0)
MCHC: 33 g/dL (ref 30.0–36.0)
MCV: 91.1 fL (ref 80.0–100.0)
Monocytes Absolute: 0.4 10*3/uL (ref 0.1–1.0)
Monocytes Relative: 6 %
Neutro Abs: 3.4 10*3/uL (ref 1.7–7.7)
Neutrophils Relative %: 56 %
Platelets: UNDETERMINED 10*3/uL (ref 150–400)
RBC: 4.85 MIL/uL (ref 3.87–5.11)
RDW: 12.3 % (ref 11.5–15.5)
WBC: 6 10*3/uL (ref 4.0–10.5)
nRBC: 0 % (ref 0.0–0.2)

## 2021-05-21 LAB — COMPREHENSIVE METABOLIC PANEL
ALT: 13 U/L (ref 0–44)
AST: 19 U/L (ref 15–41)
Albumin: 4.2 g/dL (ref 3.5–5.0)
Alkaline Phosphatase: 49 U/L (ref 38–126)
Anion gap: 7 (ref 5–15)
BUN: 12 mg/dL (ref 8–23)
CO2: 30 mmol/L (ref 22–32)
Calcium: 9.3 mg/dL (ref 8.9–10.3)
Chloride: 105 mmol/L (ref 98–111)
Creatinine, Ser: 0.7 mg/dL (ref 0.44–1.00)
GFR, Estimated: >60 mL/min (ref >60)
Glucose, Bld: 94 mg/dL (ref 70–99)
Potassium: 3.8 mmol/L (ref 3.5–5.1)
Sodium: 142 mmol/L (ref 135–145)
Total Bilirubin: 0.6 mg/dL (ref 0.3–1.2)
Total Protein: 7.5 g/dL (ref 6.5–8.1)

## 2021-05-21 LAB — LIPASE, BLOOD: Lipase: 58 U/L — ABNORMAL HIGH (ref 11–51)

## 2021-05-21 MED ORDER — LIDOCAINE VISCOUS HCL 2 % MT SOLN
OROMUCOSAL | Status: AC
  Filled 2021-05-21: qty 15

## 2021-05-21 MED ORDER — ALUM & MAG HYDROXIDE-SIMETH 200-200-20 MG/5ML PO SUSP
ORAL | Status: AC
  Filled 2021-05-21: qty 30

## 2021-05-21 MED ORDER — ALUM & MAG HYDROXIDE-SIMETH 200-200-20 MG/5ML PO SUSP
30.0000 mL | Freq: Once | ORAL | Status: AC
  Administered 2021-05-21: 30 mL via ORAL

## 2021-05-21 MED ORDER — SUCRALFATE 1 G PO TABS: 1.0000 g | ORAL_TABLET | Freq: Three times a day (TID) | ORAL | 0 refills | Status: DC

## 2021-05-21 MED ORDER — LIDOCAINE VISCOUS HCL 2 % MT SOLN
15.0000 mL | Freq: Once | OROMUCOSAL | Status: AC
  Administered 2021-05-21: 15 mL via ORAL

## 2021-05-21 NOTE — ED Provider Notes (Addendum)
[facility]   [id number] 05/21/21 Arrival Time: 1007  ASSESSMENT & PLAN:  1. Gastroesophageal reflux disease without esophagitis   Acute on chronic exacerbation.  CBC, CMP, Lipase pending.  Meds ordered this encounter  Medications   AND Linked Order Group    alum & mag hydroxide-simeth (MAALOX/MYLANTA) 200-200-20 MG/5ML suspension 30 mL    lidocaine (XYLOCAINE) 2 % viscous mouth solution 15 mL   sucralfate (CARAFATE) 1 g tablet    Sig: Take 1 tablet (1 g total) by mouth 4 (four) times daily -  with meals and at bedtime.    Dispense:  28 tablet    Refill:  0   Reports significant improvement with GI cocktail here. Is on Prilosec and Pepcid BID. Start trial of Carafate. Discussed importance of following up with her GI specialist. They will call Monday morning. Agrees to ED eval should her symptoms worsen in any way. Tolerating PO intake.    Discharge Instructions      Continue taking your reflux medications. You have had labs (blood work) drawn today. We will call you with any significant abnormalities or if there is need to begin or change treatment or pursue further follow up.  You may also review your test results online through [facility]. If you do not have a MyChart account, instructions to sign up should be on your discharge paperwork.  Call your GI specialist to arrange prompt follow up.    Reviewed expectations re: course of current medical issues. Questions answered. Outlined signs and symptoms indicating need for more acute intervention. Patient verbalized understanding. After Visit Summary given.   SUBJECTIVE: History from: patient. Beth Brooks is a 66 y.o. female who presents with complaint epigastric pain; long-standing secondary to GERD. Is followed by GI. At most recent visit they increased her Prilosec to BID but still having reflux pain. Describes "stomach burning" after eating. Belching frequently. Afebrile. Tolerating PO fluids without  n/v/d. Ambulatory. No back pain.  Patient's last menstrual period was 01/12/2012.  Past Surgical History:  Procedure Laterality Date   TUBAL LIGATION  1994    OBJECTIVE:  Vitals:   05/21/21 1036  BP: 122/77  Pulse: 60  Resp: 16  Temp: 98.2 F (36.8 C)  SpO2: 97%    General appearance: alert; no distress Oropharynx: moist Lungs: clear to auscultation bilaterally; unlabored Heart: regular rate and rhythm Abdomen: soft; non-distended; no significant abdominal tenderness; no masses or organomegaly; no guarding or rebound tenderness Back: no CVA tenderness Extremities: no edema; symmetrical with no gross deformities Skin: warm; dry Neurologic: normal gait Psychological: alert and cooperative; normal mood and affect  Labs: Labs Reviewed  CBC WITH DIFFERENTIAL/PLATELET  LIPASE, BLOOD  COMPREHENSIVE METABOLIC PANEL     No Known Allergies                                             Past Medical History:  Diagnosis Date   Allergy    Chest pain    Gastritis    GERD (gastroesophageal reflux disease)    SOB (shortness of breath)    Thrombocytopenia (HCC)    Social History   Socioeconomic History   Marital status: Widowed    Spouse name: Eddie Dibbles Adup   Number of children: 8   Years of education: 8   Highest education level: Not on file  Occupational History   Occupation: Housewife  Tobacco Use   Smoking status: Never    Passive exposure: Never   Smokeless tobacco: Never  Vaping Use   Vaping Use: Never used  Substance and Sexual Activity   Alcohol use: No   Drug use: No   Sexual activity: Not Currently    Comment: married  Other Topics Concern   Not on file  Social History Narrative   Originally from [place]   [place]   Came to [facility]. In [place] with her husband and one daughter.   Husband with hepatic carcinoma, for which he has been treated past 3 years.     She has support to give her time off.   Son who interprets takes her husband for chemo twice  weekly.   Social Determinants of Health   Financial Resource Strain: Not on file  Food Insecurity: Not on file  Transportation Needs: Not on file  Physical Activity: Not on file  Stress: Not on file  Social Connections: Not on file  Intimate Partner Violence: Not on file   Family History  Problem Relation Age of Onset   Gestational diabetes Daughter    Alcohol abuse Brother    Cerebrovascular Accident Brother        "blood clot on brain"  [place] now.   Breast cancer Neg Hx       Vanessa Kick, MD 05/21/21 4128    Vanessa Kick, MD 05/21/21 [phone]

## 2021-05-21 NOTE — Discharge Instructions (Addendum)
Meds ordered this encounter  Medications   AND Linked Order Group    alum & mag hydroxide-simeth (MAALOX/MYLANTA) 200-200-20 MG/5ML suspension 30 mL    lidocaine (XYLOCAINE) 2 % viscous mouth solution 15 mL   sucralfate (CARAFATE) 1 g tablet    Sig: Take 1 tablet (1 g total) by mouth 4 (four) times daily -  with meals and at bedtime.    Dispense:  28 tablet    Refill:  0   [place] You have had labs (blood work) drawn today. We will call you with any significant abnormalities or if there is need to begin or change treatment or pursue further follow up.  You may also review your test results online through [facility]. If you do not have a MyChart account, instructions to sign up should be on your discharge paperwork.  Call your GI specialist to arrange prompt follow up.

## 2021-05-21 NOTE — ED Triage Notes (Addendum)
Pt has heart bur un relieved with Meds. Pt has a PCP at [facility] and Pt has been to GI Specialist and has been given meds. Pt reports heart burn worse after eating. Pt reports stomach burns when drinking water.

## 2021-06-01 ENCOUNTER — Other Ambulatory Visit: Payer: Self-pay | Admitting: *Deleted

## 2021-06-01 ENCOUNTER — Inpatient Hospital Stay (HOSPITAL_BASED_OUTPATIENT_CLINIC_OR_DEPARTMENT_OTHER): Payer: 59 | Admitting: Hematology and Oncology

## 2021-06-01 ENCOUNTER — Inpatient Hospital Stay: Payer: 59 | Attending: Hematology and Oncology

## 2021-06-01 ENCOUNTER — Other Ambulatory Visit: Payer: Self-pay

## 2021-06-01 VITALS — BP 140/66 | HR 64 | Temp 97.2°F | Resp 16 | Wt 123.3 lb

## 2021-06-01 DIAGNOSIS — D696 Thrombocytopenia, unspecified: Secondary | ICD-10-CM | POA: Diagnosis not present

## 2021-06-01 DIAGNOSIS — K649 Unspecified hemorrhoids: Secondary | ICD-10-CM

## 2021-06-01 DIAGNOSIS — Z8719 Personal history of other diseases of the digestive system: Secondary | ICD-10-CM | POA: Insufficient documentation

## 2021-06-01 DIAGNOSIS — K760 Fatty (change of) liver, not elsewhere classified: Secondary | ICD-10-CM | POA: Diagnosis not present

## 2021-06-01 DIAGNOSIS — K219 Gastro-esophageal reflux disease without esophagitis: Secondary | ICD-10-CM | POA: Diagnosis not present

## 2021-06-01 DIAGNOSIS — Z79899 Other long term (current) drug therapy: Secondary | ICD-10-CM | POA: Diagnosis not present

## 2021-06-01 LAB — CMP (CANCER CENTER ONLY)
ALT: 12 U/L (ref 0–44)
AST: 16 U/L (ref 15–41)
Albumin: 4.3 g/dL (ref 3.5–5.0)
Alkaline Phosphatase: 53 U/L (ref 38–126)
Anion gap: 5 (ref 5–15)
BUN: 15 mg/dL (ref 8–23)
CO2: 31 mmol/L (ref 22–32)
Calcium: 9.4 mg/dL (ref 8.9–10.3)
Chloride: 107 mmol/L (ref 98–111)
Creatinine: 0.67 mg/dL (ref 0.44–1.00)
GFR, Estimated: >60 mL/min (ref >60)
Glucose, Bld: 86 mg/dL (ref 70–99)
Potassium: 3.5 mmol/L (ref 3.5–5.1)
Sodium: 143 mmol/L (ref 135–145)
Total Bilirubin: 0.5 mg/dL (ref 0.3–1.2)
Total Protein: 7.2 g/dL (ref 6.5–8.1)

## 2021-06-01 LAB — CBC WITH DIFFERENTIAL (CANCER CENTER ONLY)
Abs Immature Granulocytes: 0.01 10*3/uL (ref 0.00–0.07)
Basophils Absolute: 0 10*3/uL (ref 0.0–0.1)
Basophils Relative: 0 %
Eosinophils Absolute: 0.2 10*3/uL (ref 0.0–0.5)
Eosinophils Relative: 4 %
HCT: 41.9 % (ref 36.0–46.0)
Hemoglobin: 14 g/dL (ref 12.0–15.0)
Immature Granulocytes: 0 %
Lymphocytes Relative: 39 %
Lymphs Abs: 2 10*3/uL (ref 0.7–4.0)
MCH: 29.7 pg (ref 26.0–34.0)
MCHC: 33.4 g/dL (ref 30.0–36.0)
MCV: 88.8 fL (ref 80.0–100.0)
Monocytes Absolute: 0.3 10*3/uL (ref 0.1–1.0)
Monocytes Relative: 6 %
Neutro Abs: 2.6 10*3/uL (ref 1.7–7.7)
Neutrophils Relative %: 51 %
Platelet Count: 110 10*3/uL — ABNORMAL LOW (ref 150–400)
RBC: 4.72 MIL/uL (ref 3.87–5.11)
RDW: 12.3 % (ref 11.5–15.5)
WBC Count: 5.3 10*3/uL (ref 4.0–10.5)
nRBC: 0 % (ref 0.0–0.2)

## 2021-06-01 MED ORDER — SUCRALFATE 1 G PO TABS: 1.0000 g | ORAL_TABLET | Freq: Three times a day (TID) | ORAL | 2 refills | Status: DC

## 2021-06-01 NOTE — Progress Notes (Signed)
[facility] [phone]   [phone]  PROGRESS NOTE  Patient Care Team: Lattie Haw, MD as PCP - General (Family Medicine) Maximiano Coss, NP as PCP - Family Medicine (Adult Health Nurse Practitioner) Jettie Booze, MD as PCP - Cardiology (Cardiology)  Hematological/Oncological History # Thrombocytopenia 01/08/2017: WBC 5.0, Hgb 15.1, Plt 127, MCV 89 12/23/2018: WBC 5.8, Hgb 15.3, Plt 106, MCV 86 04/30/2020: WBC 5.2, Hgb 14.3, MCV 90, Plt 99 05/19/2020: establish care with Dr. Lorenso Courier  07/30/2020: WBC 6.0, Hgb 13.9, MCV 90.6, Plt 164  Interval History:  Beth Brooks 66 y.o. female with medical history significant for thrombocytopenia of unclear etiology who presents for a follow up visit. The patient's last visit was on 12/01/2020. In the interim since the last visit she has had no change in health.  On exam today Beth Brooks is accompanied by her daughter.  She notes that she has been well in the interim since her last visit.  She notes that she was recently diagnosed with GERD and has been taking sulcrafate which has provided some modest relief.  She underwent colonoscopy recently with Dr. Hilarie Fredrickson was found to have hemorrhoids on colonoscopy.  She has had about a 5 pound weight loss in the interim since her last visit.  Otherwise she is not having any overt signs of bleeding, bruising, or dark stools.  Her appetite has been good.  She otherwise denies any fevers, chills, sweats, nausea, vomiting or diarrhea.  A full 10 point ROS is listed below.  MEDICAL HISTORY:  Past Medical History:  Diagnosis Date   Allergy    Chest pain    Gastritis    GERD (gastroesophageal reflux disease)    SOB (shortness of breath)    Thrombocytopenia (HCC)     SURGICAL HISTORY: Past Surgical History:  Procedure Laterality Date   TUBAL LIGATION  1994    SOCIAL HISTORY: Social History   Socioeconomic History   Marital status: Widowed    Spouse name: Eddie Dibbles  Adup   Number of children: 8   Years of education: 8   Highest education level: Not on file  Occupational History   Occupation: Housewife  Tobacco Use   Smoking status: Never    Passive exposure: Never   Smokeless tobacco: Never  Vaping Use   Vaping Use: Never used  Substance and Sexual Activity   Alcohol use: No   Drug use: No   Sexual activity: Not Currently    Comment: married  Other Topics Concern   Not on file  Social History Narrative   Originally from [place]   [place]   Came to [facility]. In [place] with her husband and one daughter.   Husband with hepatic carcinoma, for which he has been treated past 3 years.     She has support to give her time off.   Son who interprets takes her husband for chemo twice weekly.   Social Determinants of Health   Financial Resource Strain: Not on file  Food Insecurity: Not on file  Transportation Needs: Not on file  Physical Activity: Not on file  Stress: Not on file  Social Connections: Not on file  Intimate Partner Violence: Not on file    FAMILY HISTORY: Family History  Problem Relation Age of Onset   Gestational diabetes Daughter    Alcohol abuse Brother    Cerebrovascular Accident Brother        "blood clot on brain"  [place] now.  Breast cancer Neg Hx     ALLERGIES:  has No Known Allergies.  MEDICATIONS:  Current Outpatient Medications  Medication Sig Dispense Refill   acetaminophen (TYLENOL) 325 MG tablet Take 650 mg by mouth every 6 (six) hours as needed.     albuterol (VENTOLIN HFA) 108 (90 Base) MCG/ACT inhaler Inhale 1-2 puffs into the lungs every 6 (six) hours as needed for wheezing or shortness of breath. 90 g 0   famotidine (PEPCID) 20 MG tablet Take 20 mg by mouth 2 (two) times daily.     fluticasone (FLONASE) 50 MCG/ACT nasal spray Place 1 spray into both nostrils daily. 9.9 mL 2   nitroGLYCERIN (NITROSTAT) 0.4 MG SL tablet Place 1 tablet (0.4 mg total) under the tongue every 5 (five) minutes as needed for  chest pain. (Patient not taking: Reported on 05/18/2021) 30 tablet 0   sucralfate (CARAFATE) 1 g tablet Take 1 tablet (1 g total) by mouth 4 (four) times daily -  with meals and at bedtime. 90 tablet 2   No current facility-administered medications for this visit.    REVIEW OF SYSTEMS:   Constitutional: ( - ) fevers, ( - )  chills , ( - ) night sweats Eyes: ( - ) blurriness of vision, ( - ) double vision, ( - ) watery eyes Ears, nose, mouth, throat, and face: ( - ) mucositis, ( - ) sore throat Respiratory: ( - ) cough, ( - ) dyspnea, ( - ) wheezes Cardiovascular: ( - ) palpitation, ( - ) chest discomfort, ( - ) lower extremity swelling Gastrointestinal:  ( - ) nausea, ( - ) heartburn, ( - ) change in bowel habits Skin: ( - ) abnormal skin rashes Lymphatics: ( - ) new lymphadenopathy, ( - ) easy bruising Neurological: ( - ) numbness, ( - ) tingling, ( - ) new weaknesses Behavioral/Psych: ( - ) mood change, ( - ) new changes  All other systems were reviewed with the patient and are negative.  PHYSICAL EXAMINATION:  Vitals:   06/01/21 1122  BP: 140/66  Pulse: 64  Resp: 16  Temp: (!) 97.2 F (36.2 C)  SpO2: 99%    Filed Weights   06/01/21 1122  Weight: 123 lb 4.8 oz (55.9 kg)   GENERAL: well appearing middle aged [place] female in NAD. alert, no distress and comfortable SKIN: skin color, texture, turgor are normal, no rashes or significant lesions EYES: conjunctiva are pink and non-injected, sclera clear LUNGS: clear to auscultation and percussion with normal breathing effort HEART: regular rate & rhythm and no murmurs and no lower extremity edema Musculoskeletal: no cyanosis of digits and no clubbing  PSYCH: alert & oriented x 3, fluent speech NEURO: no focal motor/sensory deficits  LABORATORY DATA:  I have reviewed the data as listed CBC Latest Ref Rng & Units 06/01/2021 05/21/2021 12/01/2020  WBC 4.0 - 10.5 K/uL 5.3 6.0 6.6  Hemoglobin 12.0 - 15.0 g/dL 14.0 14.6 14.0   Hematocrit 36.0 - 46.0 % 41.9 44.2 43.2  Platelets 150 - 400 K/uL 110(L) PLATELET CLUMPS NOTED ON SMEAR, UNABLE TO ESTIMATE 124(L)    CMP Latest Ref Rng & Units 06/01/2021 05/21/2021 12/01/2020  Glucose 70 - 99 mg/dL 86 94 134(H)  BUN 8 - 23 mg/dL '15 12 23  '$ Creatinine 0.44 - 1.00 mg/dL 0.67 0.70 0.96  Sodium 135 - 145 mmol/L 143 142 144  Potassium 3.5 - 5.1 mmol/L 3.5 3.8 4.2  Chloride 98 - 111 mmol/L 107 105 105  CO2 22 - 32 mmol/L '31 30 28  '$ Calcium 8.9 - 10.3 mg/dL 9.4 9.3 9.8  Total Protein 6.5 - 8.1 g/dL 7.2 7.5 7.8  Total Bilirubin 0.3 - 1.2 mg/dL 0.5 0.6 0.4  Alkaline Phos 38 - 126 U/L 53 49 53  AST 15 - 41 U/L '16 19 15  '$ ALT 0 - 44 U/L '12 13 10    '$ RADIOGRAPHIC STUDIES: No results found.  ASSESSMENT & PLAN Caleyah Jr 66 y.o. female with medical history significant for thrombocytopenia of unclear etiology who presents for a follow up visit.  After review the labs, the records, schedule the patient the findings most consistent with a thrombocytopenia of unclear etiology.  Possible etiologies could include liver disease or nutritional deficiency.  The patient has been screened for hep C and HIV earlier this month and been found to be negative. 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  She has already established care with gastroenterology.  #Thrombocytopenia, Mild, chronic.  --findings at this time are concerning for thrombocytopenia of unclear etiology. No correction in citrate with no clear etiology on prior nutritional labs.  --additionally will order CMP and CBC today --no evidence of clumping or clear platelet abnormality on peripheral blood film.  --negative screens for Hep C and HIV on 04/30/2020. Hepatitis B testing today. --nutritional assessment with Vitamin b12 and folate negative --MM eval with SPEP and SFLC negative  --platelets decreased to 110 today. Appear highly fluctuant. 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 Patient established care with GI --RTC in 12 months   No orders of the defined types were placed in this encounter.   All questions were answered. The patient knows to call the clinic with any problems, questions or concerns.  A total of more than 30 minutes were spent on this encounter and over half of that time was spent on counseling and coordination of care as outlined above.   Ledell Peoples, MD Department of Hematology/Oncology [place] [facility] Phone: [phone] Pager: [phone] Email: [email or web address]  06/01/2021 12:50 PM

## 2021-06-29 ENCOUNTER — Emergency Department (HOSPITAL_COMMUNITY)
Admission: EM | Admit: 2021-06-29 | Discharge: 2021-06-30 | Disposition: A | Discharge status: Home / Self Care | Payer: 59 | Attending: Emergency Medicine | Admitting: Emergency Medicine

## 2021-06-29 ENCOUNTER — Encounter (HOSPITAL_COMMUNITY): Payer: Self-pay

## 2021-06-29 ENCOUNTER — Emergency Department (HOSPITAL_COMMUNITY): Payer: 59

## 2021-06-29 ENCOUNTER — Other Ambulatory Visit: Payer: Self-pay

## 2021-06-29 DIAGNOSIS — R1013 Epigastric pain: Secondary | ICD-10-CM | POA: Insufficient documentation

## 2021-06-29 DIAGNOSIS — K219 Gastro-esophageal reflux disease without esophagitis: Secondary | ICD-10-CM | POA: Insufficient documentation

## 2021-06-29 DIAGNOSIS — R748 Abnormal levels of other serum enzymes: Secondary | ICD-10-CM | POA: Diagnosis not present

## 2021-06-29 DIAGNOSIS — D259 Leiomyoma of uterus, unspecified: Secondary | ICD-10-CM | POA: Insufficient documentation

## 2021-06-29 LAB — CBC WITH DIFFERENTIAL/PLATELET
Abs Immature Granulocytes: 0.01 10*3/uL (ref 0.00–0.07)
Basophils Absolute: 0 10*3/uL (ref 0.0–0.1)
Basophils Relative: 0 %
Eosinophils Absolute: 0.4 10*3/uL (ref 0.0–0.5)
Eosinophils Relative: 8 %
HCT: 46.1 % — ABNORMAL HIGH (ref 36.0–46.0)
Hemoglobin: 15 g/dL (ref 12.0–15.0)
Immature Granulocytes: 0 %
Lymphocytes Relative: 47 %
Lymphs Abs: 2.5 10*3/uL (ref 0.7–4.0)
MCH: 29.5 pg (ref 26.0–34.0)
MCHC: 32.5 g/dL (ref 30.0–36.0)
MCV: 90.6 fL (ref 80.0–100.0)
Monocytes Absolute: 0.4 10*3/uL (ref 0.1–1.0)
Monocytes Relative: 7 %
Neutro Abs: 2 10*3/uL (ref 1.7–7.7)
Neutrophils Relative %: 38 %
Platelets: 119 10*3/uL — ABNORMAL LOW (ref 150–400)
RBC: 5.09 MIL/uL (ref 3.87–5.11)
RDW: 12.3 % (ref 11.5–15.5)
WBC: 5.3 10*3/uL (ref 4.0–10.5)
nRBC: 0 % (ref 0.0–0.2)

## 2021-06-29 LAB — COMPREHENSIVE METABOLIC PANEL
ALT: 12 U/L (ref 0–44)
AST: 19 U/L (ref 15–41)
Albumin: 4.4 g/dL (ref 3.5–5.0)
Alkaline Phosphatase: 46 U/L (ref 38–126)
Anion gap: 6 (ref 5–15)
BUN: 18 mg/dL (ref 8–23)
CO2: 28 mmol/L (ref 22–32)
Calcium: 9.4 mg/dL (ref 8.9–10.3)
Chloride: 108 mmol/L (ref 98–111)
Creatinine, Ser: 0.86 mg/dL (ref 0.44–1.00)
GFR, Estimated: >60 mL/min (ref >60)
Glucose, Bld: 142 mg/dL — ABNORMAL HIGH (ref 70–99)
Potassium: 4.2 mmol/L (ref 3.5–5.1)
Sodium: 142 mmol/L (ref 135–145)
Total Bilirubin: 0.2 mg/dL — ABNORMAL LOW (ref 0.3–1.2)
Total Protein: 7.5 g/dL (ref 6.5–8.1)

## 2021-06-29 LAB — URINALYSIS, ROUTINE W REFLEX MICROSCOPIC
Bacteria, UA: NONE SEEN
Bilirubin Urine: NEGATIVE
Glucose, UA: NEGATIVE mg/dL
Hgb urine dipstick: NEGATIVE
Ketones, ur: NEGATIVE mg/dL
Nitrite: NEGATIVE
Protein, ur: NEGATIVE mg/dL
Specific Gravity, Urine: 1.002 — ABNORMAL LOW (ref 1.005–1.030)
pH: 9 — ABNORMAL HIGH (ref 5.0–8.0)

## 2021-06-29 LAB — LIPASE, BLOOD: Lipase: 82 U/L — ABNORMAL HIGH (ref 11–51)

## 2021-06-29 MED ORDER — LIDOCAINE VISCOUS HCL 2 % MT SOLN: 15.0000 mL | Freq: Four times a day (QID) | OROMUCOSAL | 0 refills | Status: DC | PRN

## 2021-06-29 MED ORDER — ALUM & MAG HYDROXIDE-SIMETH 200-200-20 MG/5ML PO SUSP
30.0000 mL | Freq: Once | ORAL | Status: AC
  Administered 2021-06-29: 30 mL via ORAL
  Filled 2021-06-29: qty 30

## 2021-06-29 MED ORDER — MYLANTA MAXIMUM STRENGTH 400-400-40 MG/5ML PO SUSP
15.0000 mL | ORAL | 0 refills | Status: DC | PRN
Start: 2021-06-29 — End: 2023-01-23

## 2021-06-29 MED ORDER — IOHEXOL 300 MG/ML  SOLN
80.0000 mL | Freq: Once | INTRAMUSCULAR | Status: AC | PRN
  Administered 2021-06-29: 80 mL via INTRAVENOUS

## 2021-06-29 MED ORDER — LIDOCAINE VISCOUS HCL 2 % MT SOLN
15.0000 mL | Freq: Once | OROMUCOSAL | Status: AC
  Administered 2021-06-29: 15 mL via ORAL
  Filled 2021-06-29: qty 15

## 2021-06-29 NOTE — Discharge Instructions (Addendum)
You came to the emergency department today to be evaluated for your abdominal pain.  Your physical exam and lab work were reassuring.  The CT scan of your abdomen and pelvis did not show any acute abnormalities. ? ?Please take your Carafate and omeprazole medications daily as prescribed.  Additionally have given you a prescription for Mylanta which you may use as needed every 6 hours. ? ?Please follow-up with your gastroenterologist. ? ?Get help right away if: ?Your pain does not go away as soon as your health care provider told you to expect. ?You cannot stop vomiting. ?Your pain is only in areas of the abdomen, such as the right side or the left lower portion of the abdomen. Pain on the right side could be caused by appendicitis. ?You have bloody or black stools, or stools that look like tar. ?You have severe pain, cramping, or bloating in your abdomen. ?You have signs of dehydration, such as: ?Dark urine, very little urine, or no urine. ?Cracked lips. ?Dry mouth. ?Sunken eyes. ?Sleepiness. ?Weakness. ?You have trouble breathing or chest pain. ?

## 2021-06-29 NOTE — ED Notes (Signed)
Patient transported to CT 

## 2021-06-29 NOTE — ED Provider Notes (Signed)
?[facility] DEPT ?Provider Note ? ? ?CSN: [id number] ?Arrival date & time: 06/29/21  2013 ? ?  ? ?History ? ?Chief Complaint  ?Patient presents with  ? Gastroesophageal Reflux  ? Abdominal Pain  ? ? ?Beth Brooks is a 66 y.o. female with a past medical history of gastritis, GERD, thrombocytopenia.  Presents emergency department with a complaint of acid reflux and epigastric pain.  Patient states that she has been dealing with the symptoms for "a long time."  Patient states that pain has gotten worse over the last week.  Patient is having constant epigastric pain that radiates to her left upper quadrant.  Patient states the pain is worse with any p.o. intake.  Patient has prescription for omeprazole and Carafate however does not take this regularly.  Patient states that she has tried these medications this week with no improvement in her symptoms.  Patient states that she scheduled an appointment with her gastroenterologist on 4/17 however into the emergency department as she could not wait to be seen.  Patient endorses associated nausea. ? ?Patient denies any fever, chills, abdominal distention, constipation, diarrhea, blood in stool, melena, hematemesis, coffee-ground emesis, vomiting, dysuria, hematuria, urinary urgency, urinary frequency, vaginal pain, vaginal bleeding, vaginal discharge. ? ?Patient denies any alcohol use, caffeine use, tobacco use, illicit drug use. ? ? ? ? ?Gastroesophageal Reflux ?Associated symptoms include abdominal pain. Pertinent negatives include no chest pain and no shortness of breath.  ?Abdominal Pain ?Associated symptoms: nausea   ?Associated symptoms: no chest pain, no chills, no constipation, no diarrhea, no dysuria, no fever, no hematuria, no shortness of breath, no vaginal bleeding, no vaginal discharge and no vomiting   ? ?  ? ?Home Medications ?Prior to Admission medications   ?Medication Sig Start Date End Date Taking? Authorizing Provider   ?acetaminophen (TYLENOL) 325 MG tablet Take 650 mg by mouth every 6 (six) hours as needed.    [provider]  ?albuterol (VENTOLIN HFA) 108 (90 Base) MCG/ACT inhaler Inhale 1-2 puffs into the lungs every 6 (six) hours as needed for wheezing or shortness of breath. 04/04/21   Lattie Haw, MD  ?famotidine (PEPCID) 20 MG tablet Take 20 mg by mouth 2 (two) times daily.    [provider]  ?fluticasone (FLONASE) 50 MCG/ACT nasal spray Place 1 spray into both nostrils daily. 02/09/21   Marney Setting, NP  ?nitroGLYCERIN (NITROSTAT) 0.4 MG SL tablet Place 1 tablet (0.4 mg total) under the tongue every 5 (five) minutes as needed for chest pain. ?Patient not taking: Reported on 05/18/2021 08/05/20   Wilber Oliphant, MD  ?sucralfate (CARAFATE) 1 g tablet Take 1 tablet (1 g total) by mouth 4 (four) times daily -  with meals and at bedtime. 06/01/21   Orson Slick, MD  ?   ? ?Allergies    ?Patient has no known allergies.   ? ?Review of Systems   ?Review of Systems  ?Constitutional:  Negative for chills and fever.  ?Respiratory:  Negative for shortness of breath.   ?Cardiovascular:  Negative for chest pain.  ?Gastrointestinal:  Positive for abdominal pain and nausea. Negative for abdominal distention, anal bleeding, blood in stool, constipation, diarrhea, rectal pain and vomiting.  ?Genitourinary:  Negative for decreased urine volume, difficulty urinating, dyspareunia, dysuria, flank pain, frequency, genital sores, hematuria, pelvic pain, urgency, vaginal bleeding, vaginal discharge and vaginal pain.  ?Neurological:  Negative for syncope and light-headedness.  ? ?Physical Exam ?Updated Vital Signs ?BP 135/77  Pulse 82   Temp 97.6 ?F (36.4 ?C) (Oral)   Resp 15   Ht 5' (1.524 m)   Wt 54.9 kg   LMP 01/12/2012   SpO2 97%   BMI 23.63 kg/m?  ?Physical Exam ?Vitals and nursing note reviewed.  ?Constitutional:   ?   General: She is not in acute distress. ?   Appearance: She is not ill-appearing,  toxic-appearing or diaphoretic.  ?HENT:  ?   Head: Normocephalic.  ?Eyes:  ?   General: No scleral icterus.    ?   Right eye: No discharge.     ?   Left eye: No discharge.  ?Cardiovascular:  ?   Rate and Rhythm: Normal rate.  ?Pulmonary:  ?   Effort: Pulmonary effort is normal. No tachypnea, bradypnea or prolonged expiration.  ?   Breath sounds: Normal breath sounds. No stridor.  ?Abdominal:  ?   General: Abdomen is flat. Bowel sounds are normal. There is no distension. There are no signs of injury.  ?   Palpations: Abdomen is soft. There is no mass or pulsatile mass.  ?   Tenderness: There is abdominal tenderness in the epigastric area. There is no right CVA tenderness, left CVA tenderness, guarding or rebound.  ?   Hernia: There is no hernia in the umbilical area or ventral area.  ?   Comments: Minimal epigastric tenderness  ?Skin: ?   General: Skin is warm and dry.  ?Neurological:  ?   General: No focal deficit present.  ?   Mental Status: She is alert.  ?   GCS: GCS eye subscore is 4. GCS verbal subscore is 5. GCS motor subscore is 6.  ?Psychiatric:     ?   Behavior: Behavior is cooperative.  ? ? ?ED Results / Procedures / Treatments   ?Labs ?(all labs ordered are listed, but only abnormal results are displayed) ?Labs Reviewed  ?COMPREHENSIVE METABOLIC PANEL - Abnormal; Notable for the following components:  ?    Result Value  ? Glucose, Bld 142 (*)   ? Total Bilirubin 0.2 (*)   ? All other components within normal limits  ?LIPASE, BLOOD - Abnormal; Notable for the following components:  ? Lipase 82 (*)   ? All other components within normal limits  ?CBC WITH DIFFERENTIAL/PLATELET - Abnormal; Notable for the following components:  ? HCT 46.1 (*)   ? Platelets 119 (*)   ? All other components within normal limits  ?URINALYSIS, ROUTINE W REFLEX MICROSCOPIC - Abnormal; Notable for the following components:  ? Color, Urine COLORLESS (*)   ? Specific Gravity, Urine 1.002 (*)   ? pH 9.0 (*)   ? Leukocytes,Ua TRACE (*)    ? All other components within normal limits  ? ? ?EKG ?None ? ?Radiology ?CT ABDOMEN PELVIS W CONTRAST ? ?Result Date: 06/29/2021 ?CLINICAL DATA:  Epigastric pain, bloating, and indigestion after eating. EXAM: CT ABDOMEN AND PELVIS WITH CONTRAST TECHNIQUE: Multidetector CT imaging of the abdomen and pelvis was performed using the standard protocol following bolus administration of intravenous contrast. RADIATION DOSE REDUCTION: This exam was performed according to the departmental dose-optimization program which includes automated exposure control, adjustment of the mA and/or kV according to patient size and/or use of iterative reconstruction technique. CONTRAST:  47m OMNIPAQUE IOHEXOL 300 MG/ML  SOLN COMPARISON:  Ultrasound 08/13/2020 FINDINGS: Lower chest: Lung bases are clear. Hepatobiliary: Several subcentimeter low-attenuation circumscribed lesions are too small to characterize but likely represent small cysts. No solid liver lesions identified.  Gallbladder and bile ducts are unremarkable. Pancreas: Unremarkable. No pancreatic ductal dilatation or surrounding inflammatory changes. Spleen: Normal in size without focal abnormality. Adrenals/Urinary Tract: No adrenal gland nodules. Kidneys are symmetrical. No hydronephrosis or hydroureter. Bladder is unremarkable. Stomach/Bowel: Stomach, small bowel, and colon are not abnormally distended. No wall thickening or inflammatory stranding. Appendix is normal. Vascular/Lymphatic: No significant vascular findings are present. No enlarged abdominal or pelvic lymph nodes. Reproductive: Myometrial nodularity with exophytic lesion arising from the anterior uterus consistent with uterine fibroids. No abnormal adnexal masses. Other: No free air or free fluid in the abdomen. Abdominal wall musculature appears intact. Musculoskeletal: Slight anterior subluxation of L4 on L5, likely degenerative. No acute bony abnormalities. IMPRESSION: 1. No acute process demonstrated in the  abdomen or pelvis. No evidence of bowel obstruction or inflammation. 2. Uterine fibroids. Electronically Signed   By: Lucienne Capers M.D.   On: 06/29/2021 22:48   ? ?Procedures ?Procedures  ? ? ?Medications Ordered in ED

## 2021-06-29 NOTE — ED Triage Notes (Addendum)
Pt states that she has bloating, pain, and indigestion after eating (for several months). Pt reports having a GI appt April 17th, but cannot wait because it hurts more today.  ?

## 2021-06-30 NOTE — ED Notes (Signed)
Discharge instructions including follow up care discussed with pt. Pt verbalized understanding with no questions at this time. Pt to go home with son at bedside.  ?

## 2021-07-11 ENCOUNTER — Encounter: Payer: Self-pay | Admitting: Nurse Practitioner

## 2021-07-11 ENCOUNTER — Ambulatory Visit (INDEPENDENT_AMBULATORY_CARE_PROVIDER_SITE_OTHER): Payer: 59 | Admitting: Nurse Practitioner

## 2021-07-11 VITALS — BP 122/70 | HR 56 | Ht 59.0 in | Wt 125.1 lb

## 2021-07-11 DIAGNOSIS — K219 Gastro-esophageal reflux disease without esophagitis: Secondary | ICD-10-CM

## 2021-07-11 DIAGNOSIS — G8929 Other chronic pain: Secondary | ICD-10-CM | POA: Diagnosis not present

## 2021-07-11 DIAGNOSIS — R1013 Epigastric pain: Secondary | ICD-10-CM | POA: Diagnosis not present

## 2021-07-11 NOTE — Patient Instructions (Signed)
We will contact you regarding further testing. ? ?Thank you for trusting me with your gastrointestinal care!   ? ?Tye Savoy, NP ? ? ? ? ?BMI: ? ?If you are age 65 or older, your body mass index should be between 23-30. Your Body mass index is 25.27 kg/m?Marland Kitchen If this is out of the aforementioned range listed, please consider follow up with your Primary Care Provider. ? ?If you are age 67 or younger, your body mass index should be between 19-25. Your Body mass index is 25.27 kg/m?Marland Kitchen If this is out of the aformentioned range listed, please consider follow up with your Primary Care Provider.  ? ?MY CHART: ? ?The [facility] providers would like to encourage you to use [facility] to communicate with providers for non-urgent requests or questions.  Due to long hold times on the telephone, sending your provider a message by [facility] may be a faster and more efficient way to get a response.  Please allow 48 business hours for a response.  Please remember that this is for non-urgent requests.  ? ?

## 2021-07-11 NOTE — Progress Notes (Signed)
? ? ?Assessment  ? ?Visit completed with assistance of interpreter but it was still difficult to get clear answers to some questions.  ? ?Patient profile:  ?Beth Brooks is a 66 y.o.[place] female known to Dr. Hilarie Fredrickson.  Past medical history is significant for GERD, colon polyps, diverticulosis, fatty liver, thrombocytopenia. . See PMH below for any additional history ? ?Chronic epigastric / LUQ pain around left side , up into her chest and shoulders and sometimes left arm. The best I could tell the pain occurs when she is hungry but also after eating. Carafate and PPI help but do not alleviate the pain.Viscous lidocaine and SL nitroglycerin also help. Negative EGD, [place], CTAP. Labs unremarkable except for mildly elevated lipase ( normal pancreas on CTscan) , negative stress test in May 2022. She feels like food / stomach acid it coming into chest causing symptoms.  ? ?Plan  ? ?She feels symptoms are acid reflux related though she says she is compliant with BID PPI. Maybe 24 hr pH study is reasonable.  ?For now she will continue BID PPI and QID Carafate ? ?History of Present Illness  ? ?Chief complaint: Chest pain, upper abdominal pain, reflux ? ?Dec 2022 office visit ?Rectal bleeding LUQ pain and GERD. Underwent EGD and colonoscopy . EGD was normal. Colonoscopy remarkable for adenomatous colon polyps and diverticulosis ? ?06/29/20 seen in ED for GERD and abdominal pain. Lipase mildly elevated  ( < 2x ULN). All other labs unremarkable / at baseline.  CT AP w/  contrast showed several subcentmeter low attenuation lesion in liver, too small to characterize, uterine fibroids, otherwise unremarkable.  ? ?She describes her symptoms as burninl pain generally starting in epigastrium / LUQ with radiation into chest. Pain eventually radiates into her back and up into her shoulders.She feels like food / gastric fluid is refluxing into her chest.  The is bothered by the pain when hungry though eating apparently doesn't help  because she hurts after eating. She does say that she gets relief after takes Carafate and PPI. The ED gave her Lidocaine Viscous to take Q 6 hours which  also helps. Additionally SL Nitrogylcerin also helped but she doesn't have any refills  ? ?She has already been evaluated by Cardiology for chest pain  ? ?Previous Labs / Imaging:: ? ?  Latest Ref Rng & Units 06/29/2021  ?  8:15 PM 06/01/2021  ? 11:07 AM 05/21/2021  ? 10:55 AM  ?CBC  ?WBC 4.0 - 10.5 K/uL 5.3   5.3   6.0    ?Hemoglobin 12.0 - 15.0 g/dL 15.0   14.0   14.6    ?Hematocrit 36.0 - 46.0 % 46.1   41.9   44.2    ?Platelets 150 - 400 K/uL 119   110   PLATELET CLUMPS NOTED ON SMEAR, UNABLE TO ESTIMATE    ? ? ?Lab Results  ?Component Value Date  ? LIPASE 82 (H) 06/29/2021  ? ? ?  Latest Ref Rng & Units 06/29/2021  ?  8:15 PM 06/01/2021  ? 11:07 AM 05/21/2021  ? 10:55 AM  ?CMP  ?Glucose 70 - 99 mg/dL 142   86   94    ?BUN 8 - 23 mg/dL '18   15   12    '$ ?Creatinine 0.44 - 1.00 mg/dL 0.86   0.67   0.70    ?Sodium 135 - 145 mmol/L 142   143   142    ?Potassium 3.5 - 5.1 mmol/L 4.2  3.5   3.8    ?Chloride 98 - 111 mmol/L 108   107   105    ?CO2 22 - 32 mmol/L '28   31   30    '$ ?Calcium 8.9 - 10.3 mg/dL 9.4   9.4   9.3    ?Total Protein 6.5 - 8.1 g/dL 7.5   7.2   7.5    ?Total Bilirubin 0.3 - 1.2 mg/dL 0.2   0.5   0.6    ?Alkaline Phos 38 - 126 U/L 46   53   49    ?AST 15 - 41 U/L '19   16   19    '$ ?ALT 0 - 44 U/L '12   12   13    '$ ? ? ?Previous GI Evaluations  ? ?Endoscopies: ?Dec 2022 LUQ pain and possible GERD ?- normal ? ?Dec 2022 colonoscopy  ?- The examined portion of the ileum was normal. ?- Two 3 to 4 mm polyps in the ascending colon, removed with a cold snare. Resected and ?retrieved. ?- One 5 mm polyp in the transverse colon, removed with a cold snare. Resected and ?retrieved. ?- One 5 mm polyp in the sigmoid colon, removed with a cold snare. Resected and retrieved. ?- Diverticulosis in the sigmoid colon and in the descending colon. ?- Internal hemorrhoids. Source of red  blood per rectum. ? ?Surgical [P], colon, transverse and ascending, polyp (3) ?- TUBULAR ADENOMA (2) WITHOUT HIGH GRADE DYSPLASIA. ?- BENIGN COLONIC MUCOSA (1). ?2. Surgical [P], colon, sigmoid, polyp (1) ?- TUBULAR ADENOMA (1) WITHOUT HIGH GRADE DYSPLASIA. ? ?Imaging:  ?CT ABDOMEN PELVIS W CONTRAST ?CLINICAL DATA:  Epigastric pain, bloating, and indigestion after ?eating. ? ?EXAM: ?CT ABDOMEN AND PELVIS WITH CONTRAST ? ?TECHNIQUE: ?Multidetector CT imaging of the abdomen and pelvis was performed ?using the standard protocol following bolus administration of ?intravenous contrast. ? ?RADIATION DOSE REDUCTION: This exam was performed according to the ?departmental dose-optimization program which includes automated ?exposure control, adjustment of the mA and/or kV according to ?patient size and/or use of iterative reconstruction technique. ? ?CONTRAST:  35m OMNIPAQUE IOHEXOL 300 MG/ML  SOLN ? ?COMPARISON:  Ultrasound 08/13/2020 ? ?FINDINGS: ?Lower chest: Lung bases are clear. ? ?Hepatobiliary: Several subcentimeter low-attenuation circumscribed ?lesions are too small to characterize but likely represent small ?cysts. No solid liver lesions identified. Gallbladder and bile ducts ?are unremarkable. ? ?Pancreas: Unremarkable. No pancreatic ductal dilatation or ?surrounding inflammatory changes. ? ?Spleen: Normal in size without focal abnormality. ? ?Adrenals/Urinary Tract: No adrenal gland nodules. Kidneys are ?symmetrical. No hydronephrosis or hydroureter. Bladder is ?unremarkable. ? ?Stomach/Bowel: Stomach, small bowel, and colon are not abnormally ?distended. No wall thickening or inflammatory stranding. Appendix is ?normal. ? ?Vascular/Lymphatic: No significant vascular findings are present. No ?enlarged abdominal or pelvic lymph nodes. ? ?Reproductive: Myometrial nodularity with exophytic lesion arising ?from the anterior uterus consistent with uterine fibroids. No ?abnormal adnexal masses. ? ?Other: No free air or  free fluid in the abdomen. Abdominal wall ?musculature appears intact. ? ?Musculoskeletal: Slight anterior subluxation of L4 on L5, likely ?degenerative. No acute bony abnormalities. ? ?IMPRESSION: ?1. No acute process demonstrated in the abdomen or pelvis. No ?evidence of bowel obstruction or inflammation. ?2. Uterine fibroids. ? ?Electronically Signed ?  By: WLucienne CapersM.D. ?  On: 06/29/2021 22:48 ? ? ? ?Past Medical History:  ?Diagnosis Date  ? Allergy   ? Chest pain   ? Gastritis   ? GERD (gastroesophageal reflux disease)   ? SOB (shortness of  breath)   ? Thrombocytopenia ([place])   ? ?Past Surgical History:  ?Procedure Laterality Date  ? TUBAL LIGATION  1994  ? ?Family History  ?Problem Relation Age of Onset  ? Gestational diabetes Daughter   ? Alcohol abuse Brother   ? Cerebrovascular Accident Brother   ?     "blood clot on brain"  [place] now.  ? Breast cancer Neg Hx   ? ?Social History  ? ?Tobacco Use  ? Smoking status: Never  ?  Passive exposure: Never  ? Smokeless tobacco: Never  ?Vaping Use  ? Vaping Use: Never used  ?Substance Use Topics  ? Alcohol use: No  ? Drug use: No  ? ?Current Outpatient Medications  ?Medication Sig Dispense Refill  ? acetaminophen (TYLENOL) 325 MG tablet Take 650 mg by mouth every 6 (six) hours as needed.    ? albuterol (VENTOLIN HFA) 108 (90 Base) MCG/ACT inhaler Inhale 1-2 puffs into the lungs every 6 (six) hours as needed for wheezing or shortness of breath. 90 g 0  ? alum & mag hydroxide-simeth (MYLANTA MAXIMUM STRENGTH) 400-400-40 MG/5ML suspension Take 15 mLs by mouth as needed for indigestion. 355 mL 0  ? famotidine (PEPCID) 20 MG tablet Take 20 mg by mouth 2 (two) times daily.    ? fluticasone (FLONASE) 50 MCG/ACT nasal spray Place 1 spray into both nostrils daily. 9.9 mL 2  ? lidocaine (XYLOCAINE) 2 % solution Use as directed 15 mLs in the mouth or throat every 6 (six) hours as needed for mouth pain. 200 mL 0  ? nitroGLYCERIN (NITROSTAT) 0.4 MG SL tablet Place 1 tablet  (0.4 mg total) under the tongue every 5 (five) minutes as needed for chest pain. (Patient not taking: Reported on 05/18/2021) 30 tablet 0  ? sucralfate (CARAFATE) 1 g tablet Take 1 tablet (1 g total) by

## 2021-07-15 NOTE — Progress Notes (Signed)
Addendum: Reviewed and agree with assessment and management plan. Alayah Knouff M, MD  

## 2021-08-09 ENCOUNTER — Other Ambulatory Visit: Payer: Self-pay | Admitting: Hematology and Oncology

## 2021-08-10 ENCOUNTER — Other Ambulatory Visit: Payer: Self-pay | Admitting: Hematology and Oncology

## 2021-09-13 ENCOUNTER — Other Ambulatory Visit: Payer: Self-pay

## 2021-09-13 MED ORDER — OMEPRAZOLE 40 MG PO CPDR
40.0000 mg | DELAYED_RELEASE_CAPSULE | Freq: Two times a day (BID) | ORAL | 6 refills | Status: DC
Start: 2021-09-13 — End: 2022-01-10

## 2021-10-06 ENCOUNTER — Ambulatory Visit (HOSPITAL_COMMUNITY)
Admission: EM | Admit: 2021-10-06 | Discharge: 2021-10-06 | Disposition: A | Discharge status: Home / Self Care | Payer: 59 | Attending: Physician Assistant | Admitting: Physician Assistant

## 2021-10-06 ENCOUNTER — Encounter (HOSPITAL_COMMUNITY): Payer: Self-pay | Admitting: Emergency Medicine

## 2021-10-06 ENCOUNTER — Other Ambulatory Visit: Payer: Self-pay

## 2021-10-06 DIAGNOSIS — M1712 Unilateral primary osteoarthritis, left knee: Secondary | ICD-10-CM

## 2021-10-06 DIAGNOSIS — M25562 Pain in left knee: Secondary | ICD-10-CM

## 2021-10-06 DIAGNOSIS — M79645 Pain in left finger(s): Secondary | ICD-10-CM

## 2021-10-06 MED ORDER — IBUPROFEN 600 MG PO TABS
600.0000 mg | ORAL_TABLET | Freq: Three times a day (TID) | ORAL | 0 refills | Status: DC
Start: 2021-10-06 — End: 2022-03-01

## 2021-10-06 NOTE — Discharge Instructions (Addendum)
Advised to take the omeprazole and sucralfate first, wait 30 minutes, and then take ibuprofen for knee pain. Advised icing the area frequently, 10 minutes on, 20 minutes off 3-4 times a day to help decrease the knee pain. Advised to follow-up with PCP or return to urgent care if symptoms fail to improve.

## 2021-10-06 NOTE — ED Triage Notes (Signed)
Left knee swelling.  Left thumb swelling .  Both are painful.  Left knee pain for 2 days.  Left thumb pain for a few months.  Patient denies falls

## 2021-10-06 NOTE — ED Provider Notes (Signed)
[facility]    CSN: [id number] Arrival date & time: 10/06/21  0912      History   Chief Complaint Chief Complaint  Patient presents with   Knee Pain    HPI Beth Brooks is a 66 y.o. female.   66 year old female presents with left knee and thumb pain.  History is taken through interpreter service with [place] being the language.  Patient indicates for the past several days she has been having left knee pain, with most of the tenderness being at the inferior aspect of the patella.  Patient indicates that the pain is steady, localized, and worse when she is standing and walking down steps.  Patient indicates that she has not had any trauma to the area, she does have occasional weakness with standing, and the discomfort is improved slightly with Tylenol.  Patient also indicates that she has been having left thumb pain for the past several months, and it is localized to the distal MCP joint.  Patient indicates the pain is worse with flexion, extension, and range of motion.  Patient denies any pain to the site, and is not doing any type of repetitive motions.  Patient indicates that Tylenol does not help relieve this pain.  Patient indicates that she does have dyspepsia and that she takes sucralfate and omeprazole on a regular basis and this does improve her symptoms, the omeprazole is 40 mg twice daily.   Knee Pain   Past Medical History:  Diagnosis Date   Allergy    Chest pain    Gastritis    GERD (gastroesophageal reflux disease)    SOB (shortness of breath)    Thrombocytopenia ([facility])     Patient Active Problem List   Diagnosis Date Noted   Mass of chest wall, left 04/10/2021   Chest pain 08/05/2020   Language barrier 08/05/2020   Rectal bleeding 07/20/2020   Abnormal TSH 01/24/2019   Positive ANA (antinuclear antibody) 01/24/2019   Thrombocytopenia ([place]) 01/24/2019   Overweight 01/11/2017   GERD (gastroesophageal reflux disease) 01/17/2012    Past  Surgical History:  Procedure Laterality Date   TUBAL LIGATION  1994    OB History   No obstetric history on file.      Home Medications    Prior to Admission medications   Medication Sig Start Date End Date Taking? Authorizing Provider  ibuprofen (ADVIL) 600 MG tablet Take 1 tablet (600 mg total) by mouth 3 (three) times daily. For knee pain. 10/06/21  Yes Nyoka Lint, PA-C  acetaminophen (TYLENOL) 325 MG tablet Take 650 mg by mouth every 6 (six) hours as needed.    [provider]  albuterol (VENTOLIN HFA) 108 (90 Base) MCG/ACT inhaler Inhale 1-2 puffs into the lungs every 6 (six) hours as needed for wheezing or shortness of breath. 04/04/21   Lattie Haw, MD  alum & mag hydroxide-simeth [facility] MAXIMUM STRENGTH) 400-400-40 MG/5ML suspension Take 15 mLs by mouth as needed for indigestion. 06/29/21   Loni Beckwith, PA-C  fluticasone (FLONASE) 50 MCG/ACT nasal spray Place 1 spray into both nostrils daily. 02/09/21   Marney Setting, NP  lidocaine (XYLOCAINE) 2 % solution Use as directed 15 mLs in the mouth or throat every 6 (six) hours as needed for mouth pain. 06/29/21   Loni Beckwith, PA-C  nitroGLYCERIN (NITROSTAT) 0.4 MG SL tablet Place 1 tablet (0.4 mg total) under the tongue every 5 (five) minutes as needed for chest pain. Patient not taking: Reported on 05/18/2021  08/05/20   Wilber Oliphant, MD  omeprazole (PRILOSEC) 40 MG capsule Take 1 capsule (40 mg total) by mouth 2 (two) times daily. 09/13/21   Levin Erp, PA  sucralfate (CARAFATE) 1 g tablet TAKE 1 TABLET(1 GRAM) BY MOUTH FOUR TIMES DAILY AT BEDTIME WITH MEALS 08/10/21   Orson Slick, MD    Family History Family History  Problem Relation Age of Onset   Gestational diabetes Daughter    Alcohol abuse Brother    Cerebrovascular Accident Brother        "blood clot on brain"  [place] now.   Breast cancer Neg Hx     Social History Social History   Tobacco Use   Smoking status: Never     Passive exposure: Never   Smokeless tobacco: Never  Vaping Use   Vaping Use: Never used  Substance Use Topics   Alcohol use: No   Drug use: No     Allergies   Patient has no known allergies.   Review of Systems Review of Systems  Musculoskeletal:  Positive for gait problem (left knee pain and left thumb pain).     Physical Exam Triage Vital Signs ED Triage Vitals  Enc Vitals Group     BP 10/06/21 0956 (!) 138/56     Pulse Rate 10/06/21 0956 63     Resp 10/06/21 0956 18     Temp 10/06/21 0956 98 F (36.7 C)     Temp Source 10/06/21 0956 Oral     SpO2 10/06/21 0956 99 %     Weight --      Height --      Head Circumference --      Peak Flow --      Pain Score 10/06/21 0952 4     Pain Loc --      Pain Edu? --      Excl. in [place]? --    No data found.  Updated Vital Signs BP (!) 138/56 (BP Location: Left Arm)   Pulse 63   Temp 98 F (36.7 C) (Oral)   Resp 18   LMP 01/12/2012   SpO2 99%   Visual Acuity Right Eye Distance:   Left Eye Distance:   Bilateral Distance:    Right Eye Near:   Left Eye Near:    Bilateral Near:     Physical Exam Constitutional:      Appearance: Normal appearance.  Abdominal:     General: Abdomen is flat. Bowel sounds are normal.     Palpations: Abdomen is soft.     Tenderness: There is no abdominal tenderness. There is no guarding or rebound.  Musculoskeletal:     Comments: Left hand: Left thumb with tenderness on palpation of the distal MTP joint, there is no swelling or redness, range of motion is normal with pain on flexion and extension.  Left knee: Pain is palpated along the inferior aspect of the patella at the patellar tendon, there is no redness or swelling, full range of motion is intact, stability is intact, negative drawers and negative Lachman's.  Neurological:     Mental Status: She is alert.      UC Treatments / Results  Labs (all labs ordered are listed, but only abnormal results are displayed) Labs Reviewed  - No data to display  EKG   Radiology No results found.  Procedures Procedures (including critical care time)  Medications Ordered in UC Medications - No data to display  Initial Impression /  Assessment and Plan / UC Course  I have reviewed the triage vital signs and the nursing notes.  Pertinent labs & imaging results that were available during my care of the patient were reviewed by me and considered in my medical decision making (see chart for details).    Plan: 1.  Advised patient to take the sucralfate and omeprazole first, then wait 30 minutes and take the ibuprofen 600 mg every 8-12 hours with food as needed to help relieve the left knee pain. 2.  Advised to follow-up with PCP or return to urgent care if symptoms fail to improve. Final Clinical Impressions(s) / UC Diagnoses   Final diagnoses:  Acute pain of left knee  Arthritis of knee, left  Pain of left thumb     Discharge Instructions      Advised to take the omeprazole and sucralfate first, wait 30 minutes, and then take ibuprofen for knee pain. Advised icing the area frequently, 10 minutes on, 20 minutes off 3-4 times a day to help decrease the knee pain. Advised to follow-up with PCP or return to urgent care if symptoms fail to improve.    ED Prescriptions     Medication Sig Dispense Auth. Provider   ibuprofen (ADVIL) 600 MG tablet Take 1 tablet (600 mg total) by mouth 3 (three) times daily. For knee pain. 30 tablet Nyoka Lint, PA-C      PDMP not reviewed this encounter.   Nyoka Lint, PA-C 10/06/21 1031

## 2022-01-09 NOTE — Progress Notes (Unsigned)
Annual Wellness Visit     Patient: Beth Brooks, Female    DOB: 1955-08-15, 66 y.o.   MRN: [id number]  Subjective  No chief complaint on file.   Beth Brooks is a 66 y.o. female who presents today for her Annual Wellness Visit. She reports consuming a {diet types:17450} diet. {Exercise:19826} She generally feels {well/fairly well/poorly:18703}. She reports sleeping {well/fairly well/poorly:18703}. She {does/does not:200015} have additional problems to discuss today.   HPI  {VISON DENTAL STD PSA (Optional):27386}   {History (Optional):23778}  Medications: Outpatient Medications Prior to Visit  Medication Sig   acetaminophen (TYLENOL) 325 MG tablet Take 650 mg by mouth every 6 (six) hours as needed.   albuterol (VENTOLIN HFA) 108 (90 Base) MCG/ACT inhaler Inhale 1-2 puffs into the lungs every 6 (six) hours as needed for wheezing or shortness of breath.   alum & mag hydroxide-simeth (MYLANTA MAXIMUM STRENGTH) 400-400-40 MG/5ML suspension Take 15 mLs by mouth as needed for indigestion.   fluticasone (FLONASE) 50 MCG/ACT nasal spray Place 1 spray into both nostrils daily.   ibuprofen (ADVIL) 600 MG tablet Take 1 tablet (600 mg total) by mouth 3 (three) times daily. For knee pain.   lidocaine (XYLOCAINE) 2 % solution Use as directed 15 mLs in the mouth or throat every 6 (six) hours as needed for mouth pain.   nitroGLYCERIN (NITROSTAT) 0.4 MG SL tablet Place 1 tablet (0.4 mg total) under the tongue every 5 (five) minutes as needed for chest pain. (Patient not taking: Reported on 05/18/2021)   omeprazole (PRILOSEC) 40 MG capsule Take 1 capsule (40 mg total) by mouth 2 (two) times daily.   sucralfate (CARAFATE) 1 g tablet TAKE 1 TABLET(1 GRAM) BY MOUTH FOUR TIMES DAILY AT BEDTIME WITH MEALS   No facility-administered medications prior to visit.    No Known Allergies  Patient Care Team: Alen Bleacher, MD as PCP - General (Family Medicine) Maximiano Coss, NP as PCP - Family  Medicine (Adult Health Nurse Practitioner) Jettie Booze, MD as PCP - Cardiology (Cardiology)  ROS      Objective  LMP 01/12/2012  {Vitals History (Optional):23777}  Physical Exam    Most recent functional status assessment:     No data to display         Most recent fall risk assessment:    08/03/2020    9:50 AM  [place] in the past year? 0    Most recent depression screenings:    04/06/2021    3:54 PM 08/03/2020    9:48 AM  PHQ 2/9 Scores  PHQ - 2 Score 0 0  PHQ- 9 Score 1 0   Most recent cognitive screening:     No data to display         Most recent Audit-C alcohol use screening     No data to display         A score of 3 or more in women, and 4 or more in men indicates increased risk for alcohol abuse, EXCEPT if all of the points are from question 1   Vision/Hearing Screen: No results found.  {Labs (Optional):23779}  No results found for any visits on 01/10/22.    Assessment & Plan   Annual wellness visit done today including the all of the following: Reviewed patient's Family Medical History Reviewed and updated list of patient's medical providers Assessment of cognitive impairment was done Assessed patient's functional ability Established a written schedule for health screening services Health  Risk Assessent Completed and Reviewed  Exercise Activities and Dietary recommendations  Goals   None     Immunization History  Administered Date(s) Administered   Influenza-Unspecified 02/18/2020   PFIZER Comirnaty(Gray Top)Covid-19 Tri-Sucrose Vaccine 07/20/2020   PFIZER(Purple Top)SARS-COV-2 Vaccination 06/10/2019, 06/30/2019   Tdap 01/24/2019    Health Maintenance  Topic Date Due   Zoster Vaccines- Shingrix (1 of 2) Never done   PAP SMEAR-Modifier  01/12/2020   COVID-19 Vaccine (4 - Pfizer series) 09/14/2020   INFLUENZA VACCINE  10/25/2021   MAMMOGRAM  11/02/2022   COLONOSCOPY (Pts 45-4yr Insurance coverage  will need to be confirmed)  03/14/2026   TETANUS/TDAP  01/23/2029   Hepatitis C Screening  Completed   HIV Screening  Completed   HPV VACCINES  Aged Out     Discussed health benefits of physical activity, and encouraged her to engage in regular exercise appropriate for her age and condition.    Problem List Items Addressed This Visit   None   No follow-ups on file.     JAlen Bleacher MD

## 2022-01-10 ENCOUNTER — Encounter: Payer: Self-pay | Admitting: Student

## 2022-01-10 ENCOUNTER — Ambulatory Visit (INDEPENDENT_AMBULATORY_CARE_PROVIDER_SITE_OTHER): Payer: 59 | Admitting: Student

## 2022-01-10 ENCOUNTER — Other Ambulatory Visit (HOSPITAL_COMMUNITY)
Admission: RE | Admit: 2022-01-10 | Discharge: 2022-01-10 | Disposition: A | Discharge status: Home / Self Care | Payer: 59 | Source: Ambulatory Visit | Attending: Family Medicine | Admitting: Family Medicine

## 2022-01-10 VITALS — BP 127/66 | HR 65 | Ht 59.0 in | Wt 125.4 lb

## 2022-01-10 DIAGNOSIS — Z124 Encounter for screening for malignant neoplasm of cervix: Secondary | ICD-10-CM | POA: Insufficient documentation

## 2022-01-10 DIAGNOSIS — K219 Gastro-esophageal reflux disease without esophagitis: Secondary | ICD-10-CM

## 2022-01-10 MED ORDER — ALBUTEROL SULFATE HFA 108 (90 BASE) MCG/ACT IN AERS: 1.0000 | INHALATION_SPRAY | Freq: Four times a day (QID) | RESPIRATORY_TRACT | 0 refills | Status: DC | PRN

## 2022-01-10 MED ORDER — SUCRALFATE 1 G PO TABS: ORAL_TABLET | ORAL | 0 refills | Status: DC

## 2022-01-10 MED ORDER — OMEPRAZOLE 40 MG PO CPDR: 40.0000 mg | DELAYED_RELEASE_CAPSULE | Freq: Two times a day (BID) | ORAL | 6 refills | Status: DC

## 2022-01-10 NOTE — Patient Instructions (Addendum)
It was wonderful to meet you today. Thank you for allowing me to be a part of your care. Below is a short summary of what we discussed at your visit today:  Overall healthy with no concerns.  We collected some samples for your Pap smear today.  You are due for your shingles vaccine   You can get your shingles vaccines at your pharmacy.  I Refilled your medication  Please bring all of your medications to every appointment!  If you have any questions or concerns, please do not hesitate to contact us via phone or MyChart message.   Alen Bleacher, MD [facility]

## 2022-01-11 LAB — CYTOLOGY - PAP
Comment: NEGATIVE
Diagnosis: NEGATIVE
High risk HPV: NEGATIVE

## 2022-02-02 ENCOUNTER — Other Ambulatory Visit: Payer: Self-pay | Admitting: Student

## 2022-02-07 ENCOUNTER — Other Ambulatory Visit: Payer: Self-pay | Admitting: Student

## 2022-02-07 DIAGNOSIS — K219 Gastro-esophageal reflux disease without esophagitis: Secondary | ICD-10-CM

## 2022-02-21 ENCOUNTER — Encounter (HOSPITAL_COMMUNITY): Payer: Self-pay | Admitting: Physician Assistant

## 2022-02-21 ENCOUNTER — Ambulatory Visit (HOSPITAL_COMMUNITY)
Admission: EM | Admit: 2022-02-21 | Discharge: 2022-02-21 | Disposition: A | Discharge status: Home / Self Care | Payer: 59 | Attending: Physician Assistant | Admitting: Physician Assistant

## 2022-02-21 DIAGNOSIS — L299 Pruritus, unspecified: Secondary | ICD-10-CM | POA: Diagnosis not present

## 2022-02-21 MED ORDER — CETIRIZINE HCL 10 MG PO TABS: 10.0000 mg | ORAL_TABLET | Freq: Every day | ORAL | 0 refills | Status: DC

## 2022-02-21 MED ORDER — HYDROXYZINE HCL 25 MG PO TABS
25.0000 mg | ORAL_TABLET | Freq: Three times a day (TID) | ORAL | 0 refills | Status: DC | PRN
Start: 2022-02-21 — End: 2023-01-23

## 2022-02-21 NOTE — ED Provider Notes (Signed)
[facility]    CSN: [id number] Arrival date & time: 02/21/22  5284      History   Chief Complaint Chief Complaint  Patient presents with   Allergic Reaction    HPI Beth Brooks is a 66 y.o. female.   Patient here today with daughter for evaluation of possible allergic reaction.  Daughter reports that she was bit by something outside about a month ago, unsure what this was.  She no longer has any lesion or rash from same.  She states that recently she has started itching all over, however there is no rash.  She has tried topical treatment without resolution.  She denies any fever, shortness of breath, facial swelling, difficulty swallowing.  She has not had any nausea vomiting or diarrhea.  The history is provided by the patient and a relative. A language interpreter was used (daughter).    Past Medical History:  Diagnosis Date   Allergy    Chest pain    Gastritis    GERD (gastroesophageal reflux disease)    SOB (shortness of breath)    Thrombocytopenia ([place])     Patient Active Problem List   Diagnosis Date Noted   Mass of chest wall, left 04/10/2021   Chest pain 08/05/2020   Language barrier 08/05/2020   Rectal bleeding 07/20/2020   Abnormal TSH 01/24/2019   Positive ANA (antinuclear antibody) 01/24/2019   Thrombocytopenia ([place]) 01/24/2019   Overweight 01/11/2017   GERD (gastroesophageal reflux disease) 01/17/2012    Past Surgical History:  Procedure Laterality Date   TUBAL LIGATION  1994    OB History   No obstetric history on file.      Home Medications    Prior to Admission medications   Medication Sig Start Date End Date Taking? Authorizing Provider  cetirizine (ZYRTEC) 10 MG tablet Take 1 tablet (10 mg total) by mouth daily. 02/21/22  Yes Francene Finders, PA-C  hydrOXYzine (ATARAX) 25 MG tablet Take 1 tablet (25 mg total) by mouth every 8 (eight) hours as needed for itching. 02/21/22  Yes Francene Finders, PA-C  acetaminophen  (TYLENOL) 325 MG tablet Take 650 mg by mouth every 6 (six) hours as needed.    [provider]  albuterol (VENTOLIN HFA) 108 (90 Base) MCG/ACT inhaler Inhale 1-2 puffs into the lungs every 6 (six) hours as needed for wheezing or shortness of breath. 02/02/22   Alen Bleacher, MD  alum & mag hydroxide-simeth [facility] MAXIMUM STRENGTH) 400-400-40 MG/5ML suspension Take 15 mLs by mouth as needed for indigestion. 06/29/21   Loni Beckwith, PA-C  fluticasone (FLONASE) 50 MCG/ACT nasal spray Place 1 spray into both nostrils daily. 02/09/21   Marney Setting, NP  ibuprofen (ADVIL) 600 MG tablet Take 1 tablet (600 mg total) by mouth 3 (three) times daily. For knee pain. 10/06/21   Nyoka Lint, PA-C  lidocaine (XYLOCAINE) 2 % solution Use as directed 15 mLs in the mouth or throat every 6 (six) hours as needed for mouth pain. 06/29/21   Loni Beckwith, PA-C  nitroGLYCERIN (NITROSTAT) 0.4 MG SL tablet Place 1 tablet (0.4 mg total) under the tongue every 5 (five) minutes as needed for chest pain. Patient not taking: Reported on 05/18/2021 08/05/20   Wilber Oliphant, MD  omeprazole (PRILOSEC) 40 MG capsule TAKE 1 CAPSULE BY MOUTH TWICE A DAY 02/08/22   Alen Bleacher, MD  sucralfate (CARAFATE) 1 g tablet TAKE 1 TABLET(1 GRAM) BY MOUTH FOUR TIMES DAILY AT BEDTIME WITH  MEALS 01/10/22   Alen Bleacher, MD    Family History Family History  Problem Relation Age of Onset   Gestational diabetes Daughter    Alcohol abuse Brother    Cerebrovascular Accident Brother        "blood clot on brain"  [place] now.   Breast cancer Neg Hx     Social History Social History   Tobacco Use   Smoking status: Never    Passive exposure: Never   Smokeless tobacco: Never  Vaping Use   Vaping Use: Never used  Substance Use Topics   Alcohol use: No   Drug use: No     Allergies   Patient has no known allergies.   Review of Systems Review of Systems  Constitutional:  Negative for chills and fever.  HENT:   Negative for facial swelling and trouble swallowing.   Eyes:  Negative for discharge and redness.  Respiratory:  Negative for shortness of breath.   Gastrointestinal:  Negative for nausea and vomiting.  Skin:  Negative for rash.     Physical Exam Triage Vital Signs ED Triage Vitals  Enc Vitals Group     BP      Pulse      Resp      Temp      Temp src      SpO2      Weight      Height      Head Circumference      Peak Flow      Pain Score      Pain Loc      Pain Edu?      Excl. in [place]?    No data found.  Updated Vital Signs BP 118/60 (BP Location: Left Arm)   Pulse 62   Temp 98.1 F (36.7 C) (Oral)   Resp 18   LMP 01/12/2012   SpO2 100%      Physical Exam Vitals and nursing note reviewed.  Constitutional:      General: She is not in acute distress.    Appearance: Normal appearance. She is not ill-appearing.  HENT:     Head: Normocephalic and atraumatic.  Eyes:     Conjunctiva/sclera: Conjunctivae normal.  Cardiovascular:     Rate and Rhythm: Normal rate.  Pulmonary:     Effort: Pulmonary effort is normal. No respiratory distress.  Skin:    General: Skin is warm and dry.     Findings: No rash (no rash appreciated).  Neurological:     Mental Status: She is alert.  Psychiatric:        Mood and Affect: Mood normal.        Behavior: Behavior normal.        Thought Content: Thought content normal.      UC Treatments / Results  Labs (all labs ordered are listed, but only abnormal results are displayed) Labs Reviewed - No data to display  EKG   Radiology No results found.  Procedures Procedures (including critical care time)  Medications Ordered in UC Medications - No data to display  Initial Impression / Assessment and Plan / UC Course  I have reviewed the triage vital signs and the nursing notes.  Pertinent labs & imaging results that were available during my care of the patient were reviewed by me and considered in my medical decision  making (see chart for details).    Unknown etiology of itching.  No apparent rash on exam.  Will trial antihistamine for  treatment.  Encouraged follow-up if no gradual improvement or with any further concerns.  Final Clinical Impressions(s) / UC Diagnoses   Final diagnoses:  Itching   Discharge Instructions   None    ED Prescriptions     Medication Sig Dispense Auth. Provider   cetirizine (ZYRTEC) 10 MG tablet Take 1 tablet (10 mg total) by mouth daily. 30 tablet Ewell Poe F, PA-C   hydrOXYzine (ATARAX) 25 MG tablet Take 1 tablet (25 mg total) by mouth every 8 (eight) hours as needed for itching. 12 tablet Francene Finders, PA-C      PDMP not reviewed this encounter.   Francene Finders, PA-C 02/21/22 [phone]

## 2022-02-21 NOTE — ED Triage Notes (Signed)
Per daughter pt was bit by something working outside in her yard a month ago. Pt c/o itching all over x65month Denies taking any meds for sx's.

## 2022-03-01 ENCOUNTER — Ambulatory Visit (HOSPITAL_COMMUNITY)
Admission: EM | Admit: 2022-03-01 | Discharge: 2022-03-01 | Disposition: A | Discharge status: Home / Self Care | Payer: 59 | Attending: Family Medicine | Admitting: Family Medicine

## 2022-03-01 ENCOUNTER — Encounter (HOSPITAL_COMMUNITY): Payer: Self-pay

## 2022-03-01 DIAGNOSIS — M25562 Pain in left knee: Secondary | ICD-10-CM

## 2022-03-01 DIAGNOSIS — M25561 Pain in right knee: Secondary | ICD-10-CM | POA: Diagnosis not present

## 2022-03-01 DIAGNOSIS — R319 Hematuria, unspecified: Secondary | ICD-10-CM | POA: Diagnosis not present

## 2022-03-01 LAB — POCT URINALYSIS DIPSTICK, ED / UC
Bilirubin Urine: NEGATIVE
Glucose, UA: NEGATIVE mg/dL
Ketones, ur: NEGATIVE mg/dL
Leukocytes,Ua: NEGATIVE
Nitrite: NEGATIVE
Protein, ur: NEGATIVE mg/dL
Specific Gravity, Urine: <=1.005 (ref 1.005–1.030)
Urobilinogen, UA: 0.2 mg/dL (ref 0.0–1.0)
pH: 6 (ref 5.0–8.0)

## 2022-03-01 MED ORDER — MELOXICAM 15 MG PO TABS: 15.0000 mg | ORAL_TABLET | Freq: Every day | ORAL | 0 refills | Status: DC

## 2022-03-01 NOTE — ED Triage Notes (Signed)
Pt states for the past month she sees blood on the toilet paper when she urinates. Also C/O bilateral knee pain for the past few weeks.

## 2022-03-02 NOTE — ED Provider Notes (Signed)
[facility]   [id number] 03/01/22 Arrival Time: 0909  ASSESSMENT & PLAN:  1. Hematuria, unspecified type   2. Acute pain of both knees    Unclear cause of hematuria. Urology referral placed.  Labs Reviewed  POCT URINALYSIS DIPSTICK, ED / UC - Abnormal; Notable for the following components:      Result Value   Hgb urine dipstick SMALL (*)    All other components within normal limits   Benign abdominal exam. No indications for urgent abdominal/pelvic imaging at this time. Discussed.  For knee pain, trial of: Meds ordered this encounter  Medications   meloxicam (MOBIC) 15 MG tablet    Sig: Take 1 tablet (15 mg total) by mouth daily.    Dispense:  10 tablet    Refill:  0     Follow-up Information     Schedule an appointment as soon as possible for a visit  with [facility].   Why: If your knee pain isn't improving. Contact information: [address] [place] [phone]        Schedule an appointment as soon as possible for a visit  with Alen Bleacher, MD.   Specialty: Family Medicine Why: For general follow up. Contact information: [place] [address] [phone]                 Reviewed expectations re: course of current medical issues. Questions answered. Outlined signs and symptoms indicating need for more acute intervention. Patient verbalized understanding. After Visit Summary given.   SUBJECTIVE: History from: patient and daughter who is interpreting . Beth Brooks is a 66 y.o. female who presents with complaint of noting blood in her urine for the past month; reports she sees blood on the toilet paper when she urinates. Also C/O bilateral knee pain for the past few weeks.  No injury/trauma. No tx PTA. No assoc n/v/d/abd pain. Otherwise well. No h/o similar.  Social History   Tobacco Use  Smoking Status Never   Passive exposure: Never  Smokeless  Tobacco Never    Patient's last menstrual period was 01/12/2012.  Past Surgical History:  Procedure Laterality Date   TUBAL LIGATION  1994     OBJECTIVE:  Vitals:   03/01/22 1033  BP: (!) 130/57  Pulse: 66  Resp: 16  Temp: 97.9 F (36.6 C)  TempSrc: Oral  SpO2: 100%    General appearance: alert, oriented, no acute distress HEENT: [place]; AT; oropharynx moist Lungs: unlabored respirations Abdomen: soft; without distention; no specific tenderness to palpation; normal bowel sounds; without masses or organomegaly; without guarding or rebound tenderness Back: without reported CVA tenderness; FROM at waist Extremities: without LE edema; symmetrical; without gross deformities; no specific bony TTP over knees; no knee swelling/effusion noted Skin: warm and dry Neurologic: normal gait Psychological: alert and cooperative; normal mood and affect  Labs: Results for orders placed or performed during the hospital encounter of 03/01/22  POC Urinalysis dipstick  Result Value Ref Range   Glucose, UA NEGATIVE NEGATIVE mg/dL   Bilirubin Urine NEGATIVE NEGATIVE   Ketones, ur NEGATIVE NEGATIVE mg/dL   Specific Gravity, Urine <=1.005 1.005 - 1.030   Hgb urine dipstick SMALL (A) NEGATIVE   pH 6.0 5.0 - 8.0   Protein, ur NEGATIVE NEGATIVE mg/dL   Urobilinogen, UA 0.2 0.0 - 1.0 mg/dL   Nitrite NEGATIVE NEGATIVE   Leukocytes,Ua NEGATIVE NEGATIVE   Labs Reviewed  POCT URINALYSIS DIPSTICK, ED /  UC - Abnormal; Notable for the following components:      Result Value   Hgb urine dipstick SMALL (*)    All other components within normal limits    Imaging: No results found.   No Known Allergies                                             Past Medical History:  Diagnosis Date   Allergy    Chest pain    Gastritis    GERD (gastroesophageal reflux disease)    SOB (shortness of breath)    Thrombocytopenia ([facility])     Social History   Socioeconomic History   Marital status: Widowed     Spouse name: Beth Brooks   Number of children: 8   Years of education: 8   Highest education level: Not on file  Occupational History   Occupation: Housewife  Tobacco Use   Smoking status: Never    Passive exposure: Never   Smokeless tobacco: Never  Vaping Use   Vaping Use: Never used  Substance and Sexual Activity   Alcohol use: No   Drug use: No   Sexual activity: Not Currently    Comment: married  Other Topics Concern   Not on file  Social History Narrative   Originally from [place]   [place]   Came to [facility]. In [place] with her husband and one daughter.   Husband with hepatic carcinoma, for which he has been treated past 3 years.     She has support to give her time off.   Son who interprets takes her husband for chemo twice weekly.   Social Determinants of Health   Financial Resource Strain: Not on file  Food Insecurity: Not on file  Transportation Needs: Not on file  Physical Activity: Not on file  Stress: Not on file  Social Connections: Not on file  Intimate Partner Violence: Not on file    Family History  Problem Relation Age of Onset   Gestational diabetes Daughter    Alcohol abuse Brother    Cerebrovascular Accident Brother        "blood clot on brain"  [place] now.   Breast cancer Neg Hx      Beth Kick, MD 03/02/22 1151

## 2022-05-31 ENCOUNTER — Ambulatory Visit: Payer: 59 | Admitting: Hematology and Oncology

## 2022-05-31 ENCOUNTER — Other Ambulatory Visit: Payer: 59

## 2022-06-06 ENCOUNTER — Other Ambulatory Visit: Payer: Self-pay | Admitting: Hematology and Oncology

## 2022-06-06 DIAGNOSIS — D696 Thrombocytopenia, unspecified: Secondary | ICD-10-CM

## 2022-06-07 ENCOUNTER — Other Ambulatory Visit: Payer: Self-pay

## 2022-06-07 ENCOUNTER — Inpatient Hospital Stay (HOSPITAL_BASED_OUTPATIENT_CLINIC_OR_DEPARTMENT_OTHER): Payer: Medicare Other | Admitting: Hematology and Oncology

## 2022-06-07 ENCOUNTER — Inpatient Hospital Stay: Payer: Medicare Other | Attending: Hematology and Oncology

## 2022-06-07 VITALS — BP 140/67 | HR 66 | Temp 98.0°F | Resp 15 | Wt 124.5 lb

## 2022-06-07 DIAGNOSIS — D696 Thrombocytopenia, unspecified: Secondary | ICD-10-CM

## 2022-06-07 DIAGNOSIS — K760 Fatty (change of) liver, not elsewhere classified: Secondary | ICD-10-CM | POA: Diagnosis not present

## 2022-06-07 DIAGNOSIS — R5383 Other fatigue: Secondary | ICD-10-CM | POA: Insufficient documentation

## 2022-06-07 DIAGNOSIS — R42 Dizziness and giddiness: Secondary | ICD-10-CM | POA: Diagnosis not present

## 2022-06-07 LAB — CMP (CANCER CENTER ONLY)
ALT: 18 U/L (ref 0–44)
AST: 18 U/L (ref 15–41)
Albumin: 4.4 g/dL (ref 3.5–5.0)
Alkaline Phosphatase: 57 U/L (ref 38–126)
Anion gap: 5 (ref 5–15)
BUN: 16 mg/dL (ref 8–23)
CO2: 32 mmol/L (ref 22–32)
Calcium: 9.6 mg/dL (ref 8.9–10.3)
Chloride: 104 mmol/L (ref 98–111)
Creatinine: 0.68 mg/dL (ref 0.44–1.00)
GFR, Estimated: >60 mL/min (ref >60)
Glucose, Bld: 90 mg/dL (ref 70–99)
Potassium: 3.7 mmol/L (ref 3.5–5.1)
Sodium: 141 mmol/L (ref 135–145)
Total Bilirubin: 0.6 mg/dL (ref 0.3–1.2)
Total Protein: 7.8 g/dL (ref 6.5–8.1)

## 2022-06-07 LAB — CBC WITH DIFFERENTIAL (CANCER CENTER ONLY)
Abs Immature Granulocytes: 0.01 10*3/uL (ref 0.00–0.07)
Basophils Absolute: 0 10*3/uL (ref 0.0–0.1)
Basophils Relative: 1 %
Eosinophils Absolute: 0.3 10*3/uL (ref 0.0–0.5)
Eosinophils Relative: 6 %
HCT: 44.2 % (ref 36.0–46.0)
Hemoglobin: 15 g/dL (ref 12.0–15.0)
Immature Granulocytes: 0 %
Lymphocytes Relative: 47 %
Lymphs Abs: 2.5 10*3/uL (ref 0.7–4.0)
MCH: 29.2 pg (ref 26.0–34.0)
MCHC: 33.9 g/dL (ref 30.0–36.0)
MCV: 86.2 fL (ref 80.0–100.0)
Monocytes Absolute: 0.3 10*3/uL (ref 0.1–1.0)
Monocytes Relative: 6 %
Neutro Abs: 2.1 10*3/uL (ref 1.7–7.7)
Neutrophils Relative %: 40 %
Platelet Count: 137 10*3/uL — ABNORMAL LOW (ref 150–400)
RBC: 5.13 MIL/uL — ABNORMAL HIGH (ref 3.87–5.11)
RDW: 13.1 % (ref 11.5–15.5)
WBC Count: 5.3 10*3/uL (ref 4.0–10.5)
nRBC: 0 % (ref 0.0–0.2)

## 2022-06-07 LAB — SAVE SMEAR(SSMR), FOR PROVIDER SLIDE REVIEW

## 2022-06-14 NOTE — Progress Notes (Signed)
[facility] [phone]   [phone]  PROGRESS NOTE  Patient Care Team: Alen Bleacher, MD as PCP - General (Family Medicine) Maximiano Coss, NP as PCP - Family Medicine (Adult Health Nurse Practitioner) Jettie Booze, MD as PCP - Cardiology (Cardiology)  Hematological/Oncological History # Thrombocytopenia 01/08/2017: WBC 5.0, Hgb 15.1, Plt 127, MCV 89 12/23/2018: WBC 5.8, Hgb 15.3, Plt 106, MCV 86 04/30/2020: WBC 5.2, Hgb 14.3, MCV 90, Plt 99 05/19/2020: establish care with Dr. Lorenso Courier  07/30/2020: WBC 6.0, Hgb 13.9, MCV 90.6, Plt 164  Interval History:  Beth Brooks 67 y.o. female with medical history significant for thrombocytopenia of unclear etiology who presents for a follow up visit. The patient's last visit was on 06/01/2021. In the interim since the last visit she has had no change in health.  On exam today Beth Brooks is accompanied by her daughter.  She notes she does have bouts of fatigue and dizziness.  She reports that she did have an episode where she urinated and saw some blood on the toilet paper and presented to the emergency department for that.  She also does have some occasional gum bleeding but no heavy or overt bleeding.  She reports no lightheadedness but occasional dizziness and shortness of breath.  She notes that she has discontinued most of her medications and really is only taking acetaminophen, albuterol, and Mylanta.  She notes that she is also taking multivitamins.  Overall she is at her baseline level of health.  Her appetite has been good.  She otherwise denies any fevers, chills, sweats, nausea, vomiting or diarrhea.  A full 10 point ROS is listed below.  MEDICAL HISTORY:  Past Medical History:  Diagnosis Date   Allergy    Chest pain    Gastritis    GERD (gastroesophageal reflux disease)    SOB (shortness of breath)    Thrombocytopenia ([facility])     SURGICAL HISTORY: Past Surgical History:  Procedure Laterality  Date   TUBAL LIGATION  1994    SOCIAL HISTORY: Social History   Socioeconomic History   Marital status: Widowed    Spouse name: Eddie Dibbles Adup   Number of children: 8   Years of education: 8   Highest education level: Not on file  Occupational History   Occupation: Housewife  Tobacco Use   Smoking status: Never    Passive exposure: Never   Smokeless tobacco: Never  Vaping Use   Vaping Use: Never used  Substance and Sexual Activity   Alcohol use: No   Drug use: No   Sexual activity: Not Currently    Comment: married  Other Topics Concern   Not on file  Social History Narrative   Originally from [place]   [place]   Came to [facility]. In [place] with her husband and one daughter.   Husband with hepatic carcinoma, for which he has been treated past 3 years.     She has support to give her time off.   Son who interprets takes her husband for chemo twice weekly.   Social Determinants of Health   Financial Resource Strain: Not on file  Food Insecurity: Not on file  Transportation Needs: Not on file  Physical Activity: Not on file  Stress: Not on file  Social Connections: Not on file  Intimate Partner Violence: Not on file    FAMILY HISTORY: Family History  Problem Relation Age of Onset   Gestational diabetes Daughter    Alcohol abuse Brother  Cerebrovascular Accident Brother        "blood clot on brain"  [place] now.   Breast cancer Neg Hx     ALLERGIES:  has No Known Allergies.  MEDICATIONS:  Current Outpatient Medications  Medication Sig Dispense Refill   acetaminophen (TYLENOL) 325 MG tablet Take 650 mg by mouth every 6 (six) hours as needed.     albuterol (VENTOLIN HFA) 108 (90 Base) MCG/ACT inhaler Inhale 1-2 puffs into the lungs every 6 (six) hours as needed for wheezing or shortness of breath. 18 each 0   alum & mag hydroxide-simeth (MYLANTA MAXIMUM STRENGTH) 400-400-40 MG/5ML suspension Take 15 mLs by mouth as needed for indigestion. 355 mL 0   fluticasone  (FLONASE) 50 MCG/ACT nasal spray Place 1 spray into both nostrils daily. 9.9 mL 2   cetirizine (ZYRTEC) 10 MG tablet Take 1 tablet (10 mg total) by mouth daily. (Patient not taking: Reported on 06/07/2022) 30 tablet 0   hydrOXYzine (ATARAX) 25 MG tablet Take 1 tablet (25 mg total) by mouth every 8 (eight) hours as needed for itching. (Patient not taking: Reported on 06/07/2022) 12 tablet 0   lidocaine (XYLOCAINE) 2 % solution Use as directed 15 mLs in the mouth or throat every 6 (six) hours as needed for mouth pain. (Patient not taking: Reported on 06/07/2022) 200 mL 0   meloxicam (MOBIC) 15 MG tablet Take 1 tablet (15 mg total) by mouth daily. (Patient not taking: Reported on 06/07/2022) 10 tablet 0   nitroGLYCERIN (NITROSTAT) 0.4 MG SL tablet Place 1 tablet (0.4 mg total) under the tongue every 5 (five) minutes as needed for chest pain. (Patient not taking: Reported on 05/18/2021) 30 tablet 0   omeprazole (PRILOSEC) 40 MG capsule TAKE 1 CAPSULE BY MOUTH TWICE A DAY (Patient not taking: Reported on 06/07/2022) 60 capsule 6   sucralfate (CARAFATE) 1 g tablet TAKE 1 TABLET(1 GRAM) BY MOUTH FOUR TIMES DAILY AT BEDTIME WITH MEALS (Patient not taking: Reported on 06/07/2022) 368 tablet 0   No current facility-administered medications for this visit.    REVIEW OF SYSTEMS:   Constitutional: ( - ) fevers, ( - )  chills , ( - ) night sweats Eyes: ( - ) blurriness of vision, ( - ) double vision, ( - ) watery eyes Ears, nose, mouth, throat, and face: ( - ) mucositis, ( - ) sore throat Respiratory: ( - ) cough, ( - ) dyspnea, ( - ) wheezes Cardiovascular: ( - ) palpitation, ( - ) chest discomfort, ( - ) lower extremity swelling Gastrointestinal:  ( - ) nausea, ( - ) heartburn, ( - ) change in bowel habits Skin: ( - ) abnormal skin rashes Lymphatics: ( - ) new lymphadenopathy, ( - ) easy bruising Neurological: ( - ) numbness, ( - ) tingling, ( - ) new weaknesses Behavioral/Psych: ( - ) mood change, ( - ) new  changes  All other systems were reviewed with the patient and are negative.  PHYSICAL EXAMINATION:  Vitals:   06/07/22 1025  BP: (!) 140/67  Pulse: 66  Resp: 15  Temp: 98 F (36.7 C)  SpO2: 98%    Filed Weights   06/07/22 1025  Weight: 124 lb 8 oz (56.5 kg)   GENERAL: well appearing middle aged [place] female in NAD. alert, no distress and comfortable SKIN: skin color, texture, turgor are normal, no rashes or significant lesions EYES: conjunctiva are pink and non-injected, sclera clear LUNGS: clear to auscultation and percussion with normal breathing  effort HEART: regular rate & rhythm and no murmurs and no lower extremity edema Musculoskeletal: no cyanosis of digits and no clubbing  PSYCH: alert & oriented x 3, fluent speech NEURO: no focal motor/sensory deficits  LABORATORY DATA:  I have reviewed the data as listed    Latest Ref Rng & Units 06/07/2022   10:04 AM 06/29/2021    8:15 PM 06/01/2021   11:07 AM  CBC  WBC 4.0 - 10.5 K/uL 5.3  5.3  5.3   Hemoglobin 12.0 - 15.0 g/dL 15.0  15.0  14.0   Hematocrit 36.0 - 46.0 % 44.2  46.1  41.9   Platelets 150 - 400 K/uL 137  119  110        Latest Ref Rng & Units 06/07/2022   10:04 AM 06/29/2021    8:15 PM 06/01/2021   11:07 AM  CMP  Glucose 70 - 99 mg/dL 90  142  86   BUN 8 - 23 mg/dL 16  18  15    Creatinine 0.44 - 1.00 mg/dL 0.68  0.86  0.67   Sodium 135 - 145 mmol/L 141  142  143   Potassium 3.5 - 5.1 mmol/L 3.7  4.2  3.5   Chloride 98 - 111 mmol/L 104  108  107   CO2 22 - 32 mmol/L 32  28  31   Calcium 8.9 - 10.3 mg/dL 9.6  9.4  9.4   Total Protein 6.5 - 8.1 g/dL 7.8  7.5  7.2   Total Bilirubin 0.3 - 1.2 mg/dL 0.6  0.2  0.5   Alkaline Phos 38 - 126 U/L 57  46  53   AST 15 - 41 U/L 18  19  16    ALT 0 - 44 U/L 18  12  12      RADIOGRAPHIC STUDIES: No results found.  ASSESSMENT & PLAN Beth Brooks 67 y.o. female with medical history significant for thrombocytopenia of unclear etiology who presents for a follow  up visit.  After review the labs, the records, schedule the patient the findings most consistent with a thrombocytopenia of unclear etiology.  Possible etiologies could include liver disease or nutritional deficiency.  The patient has been screened for hep C and HIV earlier this month and been found to be negative. 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  She has already established care with gastroenterology.  #Thrombocytopenia, Mild, chronic.  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 No correction in citrate with no clear etiology on prior nutritional labs.  --no evidence of clumping or clear platelet abnormality on peripheral blood film.  --negative screens for Hep C and HIV on 04/30/2020. Hepatitis B testing today. --nutritional assessment with Vitamin b12 and folate negative --MM eval with SPEP and SFLC negative  --platelets decreased to 137 today. Appear highly fluctuant. 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 Patient established care with GI, recommend continued monitoring with gastroenterology. --RTC as need.   No orders of the defined types were placed in this encounter.   All questions were answered. The patient knows to call the clinic with any problems, questions or concerns.  A total of more than 25 minutes were spent on this encounter and over half of that time was spent on counseling and coordination of care as outlined  above.   Ledell Peoples, MD Department of Hematology/Oncology [facility] Phone: [phone] Pager: [phone] Email: [email or web address]  06/14/2022 9:56 AM

## 2022-09-06 NOTE — Progress Notes (Signed)
[facility] [address] [place] [address] Dept: [phone] Dept Fax: [phone]  New Patient Office Visit  Subjective:   Beth Brooks Jan 19, 1956 09/08/2022  Chief Complaint  Patient presents with   Establish Care        Arthritis   Heartburn   Interpreter: Beth Brooks [facility] Medical Interpreter)  Due to language barrier, a vietnamese interpreter was present during the history-taking and subsequent discussion (and for the entirety of the physical exam) with this patient.  HPI: Beth Brooks presents today to establish care at [facility] Introduced to Publishing rights manager role and practice setting.  All questions answered.   Last PCP: Beth Simon MD Concerns: See below   GERD: Beth Brooks presents for the medical management of GERD. Reports having burning sensation in stomach before, during, and after eating.  Current medication: OTC supplement of Tumeric with some relief.  Well controlled: no   Heartburn frequency: daily Heartburn duration: several years, recently worsening  Alleviatiating factors:  none Aggravating factors: eating  Previous GERD medications: Omeprazole 40mg  BID, Carafate 1g QID, she states these did not help Antacid use frequency:  not currently using. Did use tums in the past but caused constipation so she stopped  Abdominal pain: LUQ , radiating to epigastric area to to left shoulder  Dysphagia: no Nausea/Vomiting: nausea  Blood in stool: no  Recent EGD: yes, 03/14/21.  Was followed by [facility] in 2022.  Did have US abdomen in 05/22 : fatty liver seen    BACK PAIN: Patient c/o pain to left upper back and neck pain with tingling sensation onset 5-6 months ago. Pain worsens with movement. No injury or recent falls. Denies bowel/bladder incontinence.  Treatments tried: none    The following portions of the patient's history were  reviewed and updated as appropriate: past medical history, past surgical history, family history, social history, allergies, medications, and problem list.   Patient Active Problem List   Diagnosis Date Noted   Fatty liver 09/08/2022   Cervical pain (neck) 09/08/2022   Mass of chest wall, left 04/10/2021   Chest pain 08/05/2020   Language barrier 08/05/2020   Rectal bleeding 07/20/2020   Abnormal TSH 01/24/2019   Positive ANA (antinuclear antibody) 01/24/2019   Thrombocytopenia (HCC) 01/24/2019   Overweight 01/11/2017   GERD (gastroesophageal reflux disease) 01/17/2012   Past Medical History:  Diagnosis Date   Allergy    Chest pain    Gastritis    GERD (gastroesophageal reflux disease)    SOB (shortness of breath)    Thrombocytopenia (HCC)    Past Surgical History:  Procedure Laterality Date   TUBAL LIGATION  1994   Family History  Problem Relation Age of Onset   Gestational diabetes Daughter    Alcohol abuse Brother    Cerebrovascular Accident Brother        "blood clot on brain"  Ok now.   Breast cancer Neg Hx    Outpatient Medications Prior to Visit  Medication Sig Dispense Refill   acetaminophen (TYLENOL) 325 MG tablet Take 650 mg by mouth daily.     albuterol (VENTOLIN HFA) 108 (90 Base) MCG/ACT inhaler Inhale 1-2 puffs into the lungs every 6 (six) hours as needed for wheezing or shortness of breath. (Patient not taking: Reported on 09/08/2022) 18 each 0   alum & mag hydroxide-simeth (MYLANTA MAXIMUM STRENGTH) 400-400-40 MG/5ML suspension Take 15 mLs by mouth as needed for indigestion. (  Patient not taking: Reported on 09/08/2022) 355 mL 0   cetirizine (ZYRTEC) 10 MG tablet Take 1 tablet (10 mg total) by mouth daily. (Patient not taking: Reported on 06/07/2022) 30 tablet 0   fluticasone (FLONASE) 50 MCG/ACT nasal spray Place 1 spray into both nostrils daily. (Patient not taking: Reported on 09/08/2022) 9.9 mL 2   hydrOXYzine (ATARAX) 25 MG tablet Take 1 tablet (25 mg  total) by mouth every 8 (eight) hours as needed for itching. (Patient not taking: Reported on 06/07/2022) 12 tablet 0   lidocaine (XYLOCAINE) 2 % solution Use as directed 15 mLs in the mouth or throat every 6 (six) hours as needed for mouth pain. (Patient not taking: Reported on 06/07/2022) 200 mL 0   meloxicam (MOBIC) 15 MG tablet Take 1 tablet (15 mg total) by mouth daily. (Patient not taking: Reported on 06/07/2022) 10 tablet 0   nitroGLYCERIN (NITROSTAT) 0.4 MG SL tablet Place 1 tablet (0.4 mg total) under the tongue every 5 (five) minutes as needed for chest pain. (Patient not taking: Reported on 05/18/2021) 30 tablet 0   sucralfate (CARAFATE) 1 g tablet TAKE 1 TABLET(1 GRAM) BY MOUTH FOUR TIMES DAILY AT BEDTIME WITH MEALS (Patient not taking: Reported on 06/07/2022) 368 tablet 0   omeprazole (PRILOSEC) 40 MG capsule TAKE 1 CAPSULE BY MOUTH TWICE A DAY (Patient not taking: Reported on 06/07/2022) 60 capsule 6   No facility-administered medications prior to visit.   No Known Allergies  ROS: A complete ROS was performed with pertinent positives/negatives noted in the HPI. The remainder of the ROS are negative.   Objective:   Today's Vitals   09/08/22 0921  BP: 120/80  Pulse: 71  Temp: 98.1 F (36.7 C)  TempSrc: Temporal  SpO2: 99%  Weight: 130 lb 3.2 oz (59.1 kg)  Height: 4\' 11"  (1.499 m)    GENERAL: Well-appearing, in NAD. Well nourished.  SKIN: Pink, warm and dry. No rash, lesion, ulceration, or ecchymoses.  NECK: Trachea midline. Full ROM. Pain with flexion and extension to cervical spine. No step offs or deformities. No lymphadenopathy.  RESPIRATORY: Chest wall symmetrical. Respirations even and non-labored. Breath sounds clear to auscultation bilaterally.  CARDIAC: S1, S2 present, regular rate and rhythm. Peripheral pulses 2+ bilaterally.  GI: Abdomen soft, non-tender. Normoactive bowel sounds. No rebound tenderness. No hepatomegaly or splenomegaly. No CVA tenderness.  MSK: Muscle  tone and strength appropriate for age.  EXTREMITIES: Without clubbing, cyanosis, or edema.  NEUROLOGIC: No motor or sensory deficits. Steady, even gait.  PSYCH/MENTAL STATUS: Alert, oriented x 3. Cooperative, appropriate mood and affect.   There are no preventive care reminders to display for this patient.   No results found for any visits on 09/08/22.     Assessment & Plan:  Left upper quadrant abdominal pain Assessment & Plan: Obtaining lab work Prescription for Protonix 20 mg p.o. twice daily, patient instructed to collect stool sample prior to starting prescription.  Interpreter explained testing and patient was in agreement and verbalized understanding.  I did review her prior ultrasound results from May 2022, and I also reviewed the most recent note from [facility] in 2022.  Orders: -     Pantoprazole Sodium; Take 1 tablet (20 mg total) by mouth 2 (two) times daily.  Dispense: 180 tablet; Refill: 0 -     Comprehensive metabolic panel -     H. pylori antigen, stool -     CBC with Differential/Platelet -     Lipase  Cervical pain (  neck) Assessment & Plan: Obtaining x-ray Awaiting results for further plan of care  Orders: -     DG Cervical Spine Complete; Future   A total of 40 minutes were spent on this encounter today. When total time is documented, this includes both the face-to-face and non-face-to-face time personally spent before, during and after the visit on the date of the encounter.   Of note, portions of this note may have been created with voice recognition software Physicist, medical) and occasional wrong-word or 'sound-a-like' substitutions may have occurred due to the inherent limitations of voice recognition software.  Return in about 3 months (around 12/09/2022) for chronic condition follow up.   Salvatore Decent, FNP

## 2022-09-08 ENCOUNTER — Ambulatory Visit (INDEPENDENT_AMBULATORY_CARE_PROVIDER_SITE_OTHER): Payer: Medicare Other | Admitting: Internal Medicine

## 2022-09-08 ENCOUNTER — Encounter: Payer: Self-pay | Admitting: Internal Medicine

## 2022-09-08 ENCOUNTER — Ambulatory Visit (INDEPENDENT_AMBULATORY_CARE_PROVIDER_SITE_OTHER)
Admission: RE | Admit: 2022-09-08 | Discharge: 2022-09-08 | Disposition: A | Discharge status: Home / Self Care | Payer: Medicare Other | Source: Ambulatory Visit | Attending: Internal Medicine | Admitting: Internal Medicine

## 2022-09-08 ENCOUNTER — Other Ambulatory Visit: Payer: Self-pay | Admitting: Internal Medicine

## 2022-09-08 VITALS — BP 120/80 | HR 71 | Temp 98.1°F | Ht 59.0 in | Wt 130.2 lb

## 2022-09-08 DIAGNOSIS — M542 Cervicalgia: Secondary | ICD-10-CM | POA: Diagnosis not present

## 2022-09-08 DIAGNOSIS — R1012 Left upper quadrant pain: Secondary | ICD-10-CM | POA: Diagnosis not present

## 2022-09-08 DIAGNOSIS — K760 Fatty (change of) liver, not elsewhere classified: Secondary | ICD-10-CM | POA: Insufficient documentation

## 2022-09-08 LAB — COMPREHENSIVE METABOLIC PANEL
ALT: 12 U/L (ref 0–35)
AST: 16 U/L (ref 0–37)
Albumin: 4.3 g/dL (ref 3.5–5.2)
Alkaline Phosphatase: 51 U/L (ref 39–117)
BUN: 20 mg/dL (ref 6–23)
CO2: 30 mEq/L (ref 19–32)
Calcium: 9.4 mg/dL (ref 8.4–10.5)
Chloride: 103 mEq/L (ref 96–112)
Creatinine, Ser: 0.67 mg/dL (ref 0.40–1.20)
GFR: 91.7 mL/min (ref >60.00)
Glucose, Bld: 86 mg/dL (ref 70–99)
Potassium: 4.1 mEq/L (ref 3.5–5.1)
Sodium: 141 mEq/L (ref 135–145)
Total Bilirubin: 0.6 mg/dL (ref 0.2–1.2)
Total Protein: 7.4 g/dL (ref 6.0–8.3)

## 2022-09-08 LAB — CBC WITH DIFFERENTIAL/PLATELET
Basophils Absolute: 0 10*3/uL (ref 0.0–0.1)
Basophils Relative: 0.3 % (ref 0.0–3.0)
Eosinophils Absolute: 0.3 10*3/uL (ref 0.0–0.7)
Eosinophils Relative: 5.6 % — ABNORMAL HIGH (ref 0.0–5.0)
HCT: 42.6 % (ref 36.0–46.0)
Hemoglobin: 14 g/dL (ref 12.0–15.0)
Lymphocytes Relative: 46 % (ref 12.0–46.0)
Lymphs Abs: 2.3 10*3/uL (ref 0.7–4.0)
MCHC: 32.7 g/dL (ref 30.0–36.0)
MCV: 90 fl (ref 78.0–100.0)
Monocytes Absolute: 0.4 10*3/uL (ref 0.1–1.0)
Monocytes Relative: 7.8 % (ref 3.0–12.0)
Neutro Abs: 2 10*3/uL (ref 1.4–7.7)
Neutrophils Relative %: 40.3 % — ABNORMAL LOW (ref 43.0–77.0)
Platelets: 118 10*3/uL — ABNORMAL LOW (ref 150.0–400.0)
RBC: 4.73 Mil/uL (ref 3.87–5.11)
RDW: 13.8 % (ref 11.5–15.5)
WBC: 4.9 10*3/uL (ref 4.0–10.5)

## 2022-09-08 LAB — LIPASE: Lipase: 51 U/L (ref 11.0–59.0)

## 2022-09-08 MED ORDER — PANTOPRAZOLE SODIUM 20 MG PO TBEC: 20.0000 mg | DELAYED_RELEASE_TABLET | Freq: Two times a day (BID) | ORAL | 0 refills | Status: DC

## 2022-09-08 NOTE — Assessment & Plan Note (Signed)
Obtaining x-ray Awaiting results for further plan of care

## 2022-09-08 NOTE — Assessment & Plan Note (Addendum)
Obtaining lab work Prescription for Protonix 20 mg p.o. twice daily, patient instructed to collect stool sample prior to starting prescription.  Interpreter explained testing and patient was in agreement and verbalized understanding.  I did review her prior ultrasound results from May 2022, and I also reviewed the most recent note from [facility] in 2022.

## 2022-09-13 LAB — H. PYLORI ANTIGEN, STOOL: H pylori Ag, Stl: NEGATIVE

## 2022-11-03 ENCOUNTER — Ambulatory Visit: Payer: Medicare Other

## 2022-11-03 VITALS — BP 100/50 | HR 69 | Temp 98.0°F | Ht 59.0 in | Wt 131.4 lb

## 2022-11-03 DIAGNOSIS — Z1231 Encounter for screening mammogram for malignant neoplasm of breast: Secondary | ICD-10-CM

## 2022-11-03 DIAGNOSIS — Z0000 Encounter for general adult medical examination without abnormal findings: Secondary | ICD-10-CM

## 2022-11-03 NOTE — Progress Notes (Signed)
Subjective:   Beth Brooks is a 67 y.o. female who presents for Medicare Annual (Subsequent) preventive examination.  Visit Complete: In person    Review of Systems     Cardiac Risk Factors include: advanced age (>52men, >19 women)     Objective:    Today's Vitals   11/03/22 1427  BP: (!) 100/50  Pulse: 69  Temp: 98 F (36.7 C)  TempSrc: Oral  SpO2: 95%  Weight: 131 lb 6.4 oz (59.6 kg)  Height: 4\' 11"  (1.499 m)  PainSc: 6    Body mass index is 26.54 kg/m.     11/03/2022    2:36 PM 01/10/2022    8:38 AM 06/29/2021    8:31 PM 08/03/2020    9:49 AM 07/20/2020    3:05 PM  Advanced Directives  Does Patient Have a Medical Advance Directive? No No No No No  Would patient like information on creating a medical advance directive? No - Patient declined  No - Patient declined No - Patient declined No - Patient declined    Current Medications (verified) Outpatient Encounter Medications as of 11/03/2022  Medication Sig   acetaminophen (TYLENOL) 325 MG tablet Take 650 mg by mouth daily.   pantoprazole (PROTONIX) 40 MG tablet Take 1 tablet (40 mg total) by mouth daily.   albuterol (VENTOLIN HFA) 108 (90 Base) MCG/ACT inhaler Inhale 1-2 puffs into the lungs every 6 (six) hours as needed for wheezing or shortness of breath. (Patient not taking: Reported on 09/08/2022)   alum & mag hydroxide-simeth (MYLANTA MAXIMUM STRENGTH) 400-400-40 MG/5ML suspension Take 15 mLs by mouth as needed for indigestion. (Patient not taking: Reported on 09/08/2022)   cetirizine (ZYRTEC) 10 MG tablet Take 1 tablet (10 mg total) by mouth daily. (Patient not taking: Reported on 06/07/2022)   fluticasone (FLONASE) 50 MCG/ACT nasal spray Place 1 spray into both nostrils daily. (Patient not taking: Reported on 09/08/2022)   hydrOXYzine (ATARAX) 25 MG tablet Take 1 tablet (25 mg total) by mouth every 8 (eight) hours as needed for itching. (Patient not taking: Reported on 06/07/2022)   lidocaine (XYLOCAINE) 2 %  solution Use as directed 15 mLs in the mouth or throat every 6 (six) hours as needed for mouth pain. (Patient not taking: Reported on 06/07/2022)   meloxicam (MOBIC) 15 MG tablet Take 1 tablet (15 mg total) by mouth daily. (Patient not taking: Reported on 06/07/2022)   nitroGLYCERIN (NITROSTAT) 0.4 MG SL tablet Place 1 tablet (0.4 mg total) under the tongue every 5 (five) minutes as needed for chest pain. (Patient not taking: Reported on 05/18/2021)   sucralfate (CARAFATE) 1 g tablet TAKE 1 TABLET(1 GRAM) BY MOUTH FOUR TIMES DAILY AT BEDTIME WITH MEALS (Patient not taking: Reported on 06/07/2022)   No facility-administered encounter medications on file as of 11/03/2022.    Allergies (verified) Patient has no known allergies.   History: Past Medical History:  Diagnosis Date   Allergy    Chest pain    Gastritis    GERD (gastroesophageal reflux disease)    SOB (shortness of breath)    Thrombocytopenia (HCC)    Past Surgical History:  Procedure Laterality Date   TUBAL LIGATION  1994   Family History  Problem Relation Age of Onset   Gestational diabetes Daughter    Alcohol abuse Brother    Cerebrovascular Accident Brother        "blood clot on brain"  Ok now.   Breast cancer Neg Hx    Social History  Socioeconomic History   Marital status: Widowed    Spouse name: Renae Fickle Adup   Number of children: 8   Years of education: 8   Highest education level: Not on file  Occupational History   Occupation: Housewife  Tobacco Use   Smoking status: Never    Passive exposure: Never   Smokeless tobacco: Never  Vaping Use   Vaping status: Never Used  Substance and Sexual Activity   Alcohol use: No   Drug use: No   Sexual activity: Not Currently    Comment: married  Other Topics Concern   Not on file  Social History Narrative   Originally from [place]   [place]   Came to [employer]. In 1999   Lives with her husband and one daughter.   Husband with hepatic carcinoma, for which he has been  treated past 3 years.     She has support to give her time off.   Son who interprets takes her husband for chemo twice weekly.   Social Determinants of Health   Financial Resource Strain: Low Risk  (11/03/2022)   Overall Financial Resource Strain (CARDIA)    Difficulty of Paying Living Expenses: Not hard at all  Food Insecurity: No Food Insecurity (11/03/2022)   Hunger Vital Sign    Worried About Running Out of Food in the Last Year: Never true    Ran Out of Food in the Last Year: Never true  Transportation Needs: No Transportation Needs (11/03/2022)   PRAPARE - Administrator, Civil Service (Medical): No    Lack of Transportation (Non-Medical): No  Physical Activity: Sufficiently Active (11/03/2022)   Exercise Vital Sign    Days of Exercise per Week: 7 days    Minutes of Exercise per Session: 30 min  Stress: No Stress Concern Present (11/03/2022)   [employer] of Occupational Health - Occupational Stress Questionnaire    Feeling of Stress : Only a little  Social Connections: Moderately Integrated (11/03/2022)   Social Connection and Isolation Panel [NHANES]    Frequency of Communication with Friends and Family: More than three times a week    Frequency of Social Gatherings with Friends and Family: Never    Attends Religious Services: More than 4 times per year    Active Member of [employer] or Organizations: Yes    Attends Banker Meetings: More than 4 times per year    Marital Status: Widowed    Tobacco Counseling Counseling given: Not Answered   Clinical Intake:  Pre-visit preparation completed: Yes  Pain : 0-10 Pain Score: 6  Pain Type: Chronic pain Pain Location: Shoulder Pain Orientation: Left Pain Descriptors / Indicators: Aching Pain Onset: More than a month ago (hurts with movement) Pain Frequency: Intermittent     Nutritional Status: BMI 25 -29 Overweight Nutritional Risks: None Diabetes: No  How often do you need to have someone help  you when you read instructions, pamphlets, or other written materials from your doctor or pharmacy?: 4 - Often  Interpreter Needed?: No  Comments: daughter interprets Information entered by :: NAllen LPN   Activities of Daily Living    11/03/2022    2:30 PM  In your present state of health, do you have any difficulty performing the following activities:  Hearing? 0  Vision? 0  Difficulty concentrating or making decisions? 0  Walking or climbing stairs? 0  Dressing or bathing? 0  Doing errands, shopping? 0  Preparing Food and eating ? N  Using the Toilet?  N  In the past six months, have you accidently leaked urine? N  Do you have problems with loss of bowel control? N  Managing your Medications? N  Managing your Finances? N  Housekeeping or managing your Housekeeping? N    Patient Care Team: Salvatore Decent, FNP as PCP - General (Internal Medicine) Janeece Agee, NP as PCP - Family Medicine (Adult Health Nurse Practitioner) Corky Crafts, MD as PCP - Cardiology (Cardiology)  Indicate any recent Medical Services you may have received from other than [facility] providers in the past year (date may be approximate).     Assessment:   This is a routine wellness examination for [place].  Hearing/Vision screen Hearing Screening - Comments:: Denies hearing issues Vision Screening - Comments:: Regular eye exams, [facility]  Dietary issues and exercise activities discussed:     Goals Addressed             This Visit's Progress    Patient Stated       11/03/2022, will work on drinking a little more water       Depression Screen    11/03/2022    2:39 PM 09/08/2022   10:53 AM 01/10/2022    8:38 AM 04/06/2021    3:54 PM 08/03/2020    9:48 AM 07/20/2020    3:02 PM 04/30/2020    1:08 PM  PHQ 2/9 Scores  PHQ - 2 Score 0 2 1 0 0 0 0  PHQ- 9 Score 1 6 4 1  0 0     Fall Risk    11/03/2022    2:38 PM 09/08/2022    9:20 AM 08/03/2020    9:50 AM 04/30/2020    1:08 PM  12/23/2018    9:44 AM  Fall Risk   Falls in the past year? 0 0 0 0 0  Number falls in past yr: 0 0  0 0  Injury with Fall? 0 0  0 0  Risk for fall due to : Medication side effect No Fall Risks  No Fall Risks   Follow up Falls prevention discussed;Falls evaluation completed Falls prevention discussed  Falls evaluation completed     MEDICARE RISK AT HOME:  Medicare Risk at Home - 11/03/22 1439     Any stairs in or around the home? No    If so, are there any without handrails? No    Home free of loose throw rugs in walkways, pet beds, electrical cords, etc? Yes    Adequate lighting in your home to reduce risk of falls? Yes    Life alert? No    Use of a cane, walker or w/c? No    Grab bars in the bathroom? No    Shower chair or bench in shower? Yes    Elevated toilet seat or a handicapped toilet? No             TIMED UP AND GO:  Was the test performed?  Yes  Length of time to ambulate 10 feet: 5 sec Gait steady and fast without use of assistive device    Cognitive Function:  6 CIT not administered due to language barrier. Appears cognitive via direct observation and conversation through daughter.        Immunizations Immunization History  Administered Date(s) Administered   Influenza-Unspecified 02/18/2020, 12/25/2021   PFIZER Comirnaty(Gray Top)Covid-19 Tri-Sucrose Vaccine 07/20/2020   PFIZER(Purple Top)SARS-COV-2 Vaccination 06/10/2019, 06/30/2019   Tdap 01/24/2019    TDAP status: Up to date  Flu  Vaccine status: Due, Education has been provided regarding the importance of this vaccine. Advised may receive this vaccine at local pharmacy or Health Dept. Aware to provide a copy of the vaccination record if obtained from local pharmacy or Health Dept. Verbalized acceptance and understanding.  Pneumococcal vaccine status: Due, Education has been provided regarding the importance of this vaccine. Advised may receive this vaccine at local pharmacy or Health Dept. Aware to  provide a copy of the vaccination record if obtained from local pharmacy or Health Dept. Verbalized acceptance and understanding.  Covid-19 vaccine status: Information provided on how to obtain vaccines.   Qualifies for Shingles Vaccine? Yes   Zostavax completed No   Shingrix Completed?: No.    Education has been provided regarding the importance of this vaccine. Patient has been advised to call insurance company to determine out of pocket expense if they have not yet received this vaccine. Advised may also receive vaccine at local pharmacy or Health Dept. Verbalized acceptance and understanding.  Screening Tests Health Maintenance  Topic Date Due   MAMMOGRAM  11/02/2022   INFLUENZA VACCINE  10/26/2022   Pneumonia Vaccine 68+ Years old (1 of 1 - PCV) 03/10/2023 (Originally 03/26/2022)   DEXA SCAN  03/10/2023 (Originally 03/26/2022)   COVID-19 Vaccine (4 - 2023-24 season) 03/10/2023 (Originally 11/25/2021)   Zoster Vaccines- Shingrix (1 of 2) 03/10/2023 (Originally 03/27/2007)   Medicare Annual Wellness (AWV)  11/03/2023   PAP SMEAR-Modifier  01/10/2025   Colonoscopy  03/14/2026   DTaP/Tdap/Td (2 - Td or Tdap) 01/23/2029   Hepatitis C Screening  Completed   HIV Screening  Completed   HPV VACCINES  Aged Out    Health Maintenance  Health Maintenance Due  Topic Date Due   MAMMOGRAM  11/02/2022   INFLUENZA VACCINE  10/26/2022    Colorectal cancer screening: Type of screening: Colonoscopy. Completed 03/14/2021. Repeat every 5 years  Mammogram status: Ordered today. Pt provided with contact info and advised to call to schedule appt.   Bone Density status: ordered today  Lung Cancer Screening: (Low Dose CT Chest recommended if Age 62-80 years, 20 pack-year currently smoking OR have quit w/in 15years.) does not qualify.   Lung Cancer Screening Referral: no  Additional Screening:  Hepatitis C Screening: does qualify; Completed 04/30/2020  Vision Screening: Recommended annual  ophthalmology exams for early detection of glaucoma and other disorders of the eye. Is the patient up to date with their annual eye exam?  Yes  Who is the provider or what is the name of the office in which the patient attends annual eye exams? [facility] If pt is not established with a provider, would they like to be referred to a provider to establish care? No .   Dental Screening: Recommended annual dental exams for proper oral hygiene  Diabetic Foot Exam: n/a  Community Resource Referral / Chronic Care Management: CRR required this visit?  No   CCM required this visit?  No     Plan:     I have personally reviewed and noted the following in the patient's chart:   Medical and social history Use of alcohol, tobacco or illicit drugs  Current medications and supplements including opioid prescriptions. Patient is not currently taking opioid prescriptions. Functional ability and status Nutritional status Physical activity Advanced directives List of other physicians Hospitalizations, surgeries, and ER visits in previous 12 months Vitals Screenings to include cognitive, depression, and falls Referrals and appointments  In addition, I have reviewed and discussed  with patient certain preventive protocols, quality metrics, and best practice recommendations. A written personalized care plan for preventive services as well as general preventive health recommendations were provided to patient.     Barb Merino, LPN   04/01/1094   After Visit Summary: (Pick Up) Due to this being a telephonic visit, with patients personalized plan was offered to patient and patient has requested to Pick up at office.  Nurse Notes: none

## 2022-11-03 NOTE — Patient Instructions (Signed)
Beth Brooks , Thank you for taking time to come for your Medicare Wellness Visit. I appreciate your ongoing commitment to your health goals. Please review the following plan we discussed and let me know if I can assist you in the future.   Referrals/Orders/Follow-Ups/Clinician Recommendations: bone density  You have an order for:  []   2D Mammogram  []   3D Mammogram  [x]   Bone Density     Please call for appointment:  [facility] [address] [place], [place] [address] [phone]    Make sure to wear two-piece clothing.  No lotions, powders, or deodorants the day of the appointment. Make sure to bring picture ID and insurance card.  Bring list of medications you are currently taking including any supplements.   Schedule your [facility] screening mammogram through MyChart!   Log into your MyChart account.  Go to 'Visit' (or 'Appointments' if on mobile App) --> Schedule an Appointment  Under 'Select a Reason for Visit' choose the Mammogram Screening option.  Complete the pre-visit questions and select the time and place that best fits your schedule.    This is a list of the screening recommended for you and due dates:  Health Maintenance  Topic Date Due   Mammogram  11/02/2022   Flu Shot  10/26/2022   Pneumonia Vaccine (1 of 1 - PCV) 03/10/2023*   DEXA scan (bone density measurement)  03/10/2023*   COVID-19 Vaccine (4 - 2023-24 season) 03/10/2023*   Zoster (Shingles) Vaccine (1 of 2) 03/10/2023*   Medicare Annual Wellness Visit  11/03/2023   Pap Smear  01/10/2025   Colon Cancer Screening  03/14/2026   DTaP/Tdap/Td vaccine (2 - Td or Tdap) 01/23/2029   Hepatitis C Screening  Completed   HIV Screening  Completed   HPV Vaccine  Aged Out  *Topic was postponed. The date shown is not the original due date.    Advanced directives: (Declined) Advance directive discussed with you today. Even though you declined this today, please call our office  should you change your mind, and we can give you the proper paperwork for you to fill out.  Next Medicare Annual Wellness Visit scheduled for next year: Yes  Preventive Care 66 Years and Older, Female Preventive care refers to lifestyle choices and visits with your health care provider that can promote health and wellness. What does preventive care include? A yearly physical exam. This is also called an annual well check. Dental exams once or twice a year. Routine eye exams. Ask your health care provider how often you should have your eyes checked. Personal lifestyle choices, including: Daily care of your teeth and gums. Regular physical activity. Eating a healthy diet. Avoiding tobacco and drug use. Limiting alcohol use. Practicing safe sex. Taking low-dose aspirin every day. Taking vitamin and mineral supplements as recommended by your health care provider. What happens during an annual well check? The services and screenings done by your health care provider during your annual well check will depend on your age, overall health, lifestyle risk factors, and family history of disease. Counseling  Your health care provider may ask you questions about your: Alcohol use. Tobacco use. Drug use. Emotional well-being. Home and relationship well-being. Sexual activity. Eating habits. History of falls. Memory and ability to understand (cognition). Work and work Astronomer. Reproductive health. Screening  You may have the following tests or measurements: Height, weight, and BMI. Blood pressure. Lipid and cholesterol levels. These may be checked every 5 years, or  more frequently if you are over 40 years old. Skin check. Lung cancer screening. You may have this screening every year starting at age 68 if you have a 30-pack-year history of smoking and currently smoke or have quit within the past 15 years. Fecal occult blood test (FOBT) of the stool. You may have this test every year  starting at age 29. Flexible sigmoidoscopy or colonoscopy. You may have a sigmoidoscopy every 5 years or a colonoscopy every 10 years starting at age 38. Hepatitis C blood test. Hepatitis B blood test. Sexually transmitted disease (STD) testing. Diabetes screening. This is done by checking your blood sugar (glucose) after you have not eaten for a while (fasting). You may have this done every 1-3 years. Bone density scan. This is done to screen for osteoporosis. You may have this done starting at age 34. Mammogram. This may be done every 1-2 years. Talk to your health care provider about how often you should have regular mammograms. Talk with your health care provider about your test results, treatment options, and if necessary, the need for more tests. Vaccines  Your health care provider may recommend certain vaccines, such as: Influenza vaccine. This is recommended every year. Tetanus, diphtheria, and acellular pertussis (Tdap, Td) vaccine. You may need a Td booster every 10 years. Zoster vaccine. You may need this after age 11. Pneumococcal 13-valent conjugate (PCV13) vaccine. One dose is recommended after age 64. Pneumococcal polysaccharide (PPSV23) vaccine. One dose is recommended after age 21. Talk to your health care provider about which screenings and vaccines you need and how often you need them. This information is not intended to replace advice given to you by your health care provider. Make sure you discuss any questions you have with your health care provider. Document Released: 04/09/2015 Document Revised: 12/01/2015 Document Reviewed: 01/12/2015 Elsevier Interactive Patient Education  2017 [employer].  Fall Prevention in the Home Falls can cause injuries. They can happen to people of all ages. There are many things you can do to make your home safe and to help prevent falls. What can I do on the outside of my home? Regularly fix the edges of walkways and driveways and fix any  cracks. Remove anything that might make you trip as you walk through a door, such as a raised step or threshold. Trim any bushes or trees on the path to your home. Use bright outdoor lighting. Clear any walking paths of anything that might make someone trip, such as rocks or tools. Regularly check to see if handrails are loose or broken. Make sure that both sides of any steps have handrails. Any raised decks and porches should have guardrails on the edges. Have any leaves, snow, or ice cleared regularly. Use sand or salt on walking paths during winter. Clean up any spills in your garage right away. This includes oil or grease spills. What can I do in the bathroom? Use night lights. Install grab bars by the toilet and in the tub and shower. Do not use towel bars as grab bars. Use non-skid mats or decals in the tub or shower. If you need to sit down in the shower, use a plastic, non-slip stool. Keep the floor dry. Clean up any water that spills on the floor as soon as it happens. Remove soap buildup in the tub or shower regularly. Attach bath mats securely with double-sided non-slip rug tape. Do not have throw rugs and other things on the floor that can make you trip. What  can I do in the bedroom? Use night lights. Make sure that you have a light by your bed that is easy to reach. Do not use any sheets or blankets that are too big for your bed. They should not hang down onto the floor. Have a firm chair that has side arms. You can use this for support while you get dressed. Do not have throw rugs and other things on the floor that can make you trip. What can I do in the kitchen? Clean up any spills right away. Avoid walking on wet floors. Keep items that you use a lot in easy-to-reach places. If you need to reach something above you, use a strong step stool that has a grab bar. Keep electrical cords out of the way. Do not use floor polish or wax that makes floors slippery. If you must  use wax, use non-skid floor wax. Do not have throw rugs and other things on the floor that can make you trip. What can I do with my stairs? Do not leave any items on the stairs. Make sure that there are handrails on both sides of the stairs and use them. Fix handrails that are broken or loose. Make sure that handrails are as long as the stairways. Check any carpeting to make sure that it is firmly attached to the stairs. Fix any carpet that is loose or worn. Avoid having throw rugs at the top or bottom of the stairs. If you do have throw rugs, attach them to the floor with carpet tape. Make sure that you have a light switch at the top of the stairs and the bottom of the stairs. If you do not have them, ask someone to add them for you. What else can I do to help prevent falls? Wear shoes that: Do not have high heels. Have rubber bottoms. Are comfortable and fit you well. Are closed at the toe. Do not wear sandals. If you use a stepladder: Make sure that it is fully opened. Do not climb a closed stepladder. Make sure that both sides of the stepladder are locked into place. Ask someone to hold it for you, if possible. Clearly mark and make sure that you can see: Any grab bars or handrails. First and last steps. Where the edge of each step is. Use tools that help you move around (mobility aids) if they are needed. These include: Canes. Walkers. Scooters. Crutches. Turn on the lights when you go into a dark area. Replace any light bulbs as soon as they burn out. Set up your furniture so you have a clear path. Avoid moving your furniture around. If any of your floors are uneven, fix them. If there are any pets around you, be aware of where they are. Review your medicines with your doctor. Some medicines can make you feel dizzy. This can increase your chance of falling. Ask your doctor what other things that you can do to help prevent falls. This information is not intended to replace  advice given to you by your health care provider. Make sure you discuss any questions you have with your health care provider. Document Released: 01/07/2009 Document Revised: 08/19/2015 Document Reviewed: 04/17/2014 Elsevier Interactive Patient Education  2017 [employer].

## 2022-11-03 NOTE — Addendum Note (Signed)
Addended by: Larey Dresser on: 11/03/2022 03:22 PM   Modules accepted: Orders

## 2022-12-07 NOTE — Progress Notes (Signed)
[facility] [address] [place] [address] Dept: [phone] Dept Fax: [phone]    Subjective:   Beth Brooks 01-09-1956 12/08/2022  Chief Complaint  Patient presents with   Follow-up    Mediation change    Heartburn   Due to language barrier, a medical interpreter was present during the HPI, ROS, and discussion for the plan of care.  Interpreter: Tresa Endo    HPI: Beth Brooks presents today for re-assessment and management of chronic medical conditions.  GERD: Beth Brooks presents for the medical management of GERD. Patient reports she has noticed some improvement on Protonix 40mg  PO daily, but not complete resolution of symptoms. Certain foods trigger symptoms : spices, fruits, tomatoes. She tries to avoid eating them, but it is hard when she eats out at [employer].  Current medication: protonix 40mg   Well controlled: no  Heartburn frequency: daily Antacid use frequency:  daily  Abdominal pain: yes - epigastric pain after eating  Dysphagia: no Nausea/Vomiting: no Blood in stool: no  Recently checked for h. Pylori from stool specimen - none detected.  Recent EGD: yes, 03/14/21.  Was followed by [facility] in 2022.  Did have US abdomen in 05/22 : fatty liver seen    HEALTH MAINTENANCE:  Prevnar 20: Due   Shingrix: has had 1 dose, due for second in October - daughter will bring records to office to upload in patient's chart.  Mammogram: scheduled for 12/11/22 UTD pap smear    The following portions of the patient's history were reviewed and updated as appropriate: past medical history, past surgical history, family history, social history, allergies, medications, and problem list.   Patient Active Problem List   Diagnosis Date Noted   Fatty liver 09/08/2022   Cervical pain (neck) 09/08/2022   Mass of chest wall, left 04/10/2021   Chest pain 08/05/2020   Language barrier 08/05/2020    Rectal bleeding 07/20/2020   Abnormal TSH 01/24/2019   Positive ANA (antinuclear antibody) 01/24/2019   Thrombocytopenia (HCC) 01/24/2019   Overweight 01/11/2017   GERD (gastroesophageal reflux disease) 01/17/2012   Past Medical History:  Diagnosis Date   Allergy    Chest pain    Gastritis    GERD (gastroesophageal reflux disease)    SOB (shortness of breath)    Thrombocytopenia (HCC)    Past Surgical History:  Procedure Laterality Date   TUBAL LIGATION  1994   Family History  Problem Relation Age of Onset   Gestational diabetes Daughter    Alcohol abuse Brother    Cerebrovascular Accident Brother        "blood clot on brain"  Ok now.   Breast cancer Neg Hx     Current Outpatient Medications:    acetaminophen (TYLENOL) 325 MG tablet, Take 650 mg by mouth daily., Disp: , Rfl:    alum & mag hydroxide-simeth (MYLANTA MAXIMUM STRENGTH) 400-400-40 MG/5ML suspension, Take 15 mLs by mouth as needed for indigestion., Disp: 355 mL, Rfl: 0   fluticasone (FLONASE) 50 MCG/ACT nasal spray, Place 1 spray into both nostrils daily., Disp: 9.9 mL, Rfl: 2   sucralfate (CARAFATE) 1 GM/10ML suspension, Take 10 mLs (1 g total) by mouth 4 (four) times daily -  with meals and at bedtime for 10 days., Disp: 400 mL, Rfl: 0   albuterol (VENTOLIN HFA) 108 (90 Base) MCG/ACT inhaler, Inhale 1-2 puffs into the lungs every 6 (six) hours as needed for wheezing or shortness of breath. (Patient  not taking: Reported on 12/08/2022), Disp: 18 each, Rfl: 0   cetirizine (ZYRTEC) 10 MG tablet, Take 1 tablet (10 mg total) by mouth daily. (Patient not taking: Reported on 06/07/2022), Disp: 30 tablet, Rfl: 0   hydrOXYzine (ATARAX) 25 MG tablet, Take 1 tablet (25 mg total) by mouth every 8 (eight) hours as needed for itching. (Patient not taking: Reported on 06/07/2022), Disp: 12 tablet, Rfl: 0   lidocaine (XYLOCAINE) 2 % solution, Use as directed 15 mLs in the mouth or throat every 6 (six) hours as needed for mouth pain.  (Patient not taking: Reported on 06/07/2022), Disp: 200 mL, Rfl: 0   meloxicam (MOBIC) 15 MG tablet, Take 1 tablet (15 mg total) by mouth daily. (Patient not taking: Reported on 06/07/2022), Disp: 10 tablet, Rfl: 0   nitroGLYCERIN (NITROSTAT) 0.4 MG SL tablet, Place 1 tablet (0.4 mg total) under the tongue every 5 (five) minutes as needed for chest pain. (Patient not taking: Reported on 05/18/2021), Disp: 30 tablet, Rfl: 0   pantoprazole (PROTONIX) 40 MG tablet, Take 1 tablet (40 mg total) by mouth 2 (two) times daily before a meal., Disp: 120 tablet, Rfl: 0 No Known Allergies   ROS: A complete ROS was performed with pertinent positives/negatives noted in the HPI. The remainder of the ROS are negative.    Objective:   Today's Vitals   12/08/22 0955  BP: 120/68  Pulse: 62  Temp: 98.4 F (36.9 C)  TempSrc: Temporal  SpO2: 99%  Weight: 131 lb (59.4 kg)  Height: 4\' 11"  (1.499 m)    GENERAL: Well-appearing, in NAD. Well nourished.  SKIN: Pink, warm and dry.  NECK: Trachea midline. Full ROM w/o pain or tenderness. No lymphadenopathy.  RESPIRATORY: Chest wall symmetrical. Respirations even and non-labored. Breath sounds clear to auscultation bilaterally.  CARDIAC: S1, S2 present, regular rate and rhythm. Peripheral pulses 2+ bilaterally.  GI: Abdomen soft, non-tender to palpation. EXTREMITIES: Without clubbing, cyanosis, or edema.  NEUROLOGIC:  Steady, even gait.  PSYCH/MENTAL STATUS: Alert, oriented x 3. Cooperative, appropriate mood and affect.   Health Maintenance Due  Topic Date Due   MAMMOGRAM  11/02/2022    No results found for any visits on 12/08/22.  The 10-year ASCVD risk score (Arnett DK, et al., 2019) is: 4.8%     Assessment & Plan:  1. Gastroesophageal reflux disease, unspecified whether esophagitis present - sucralfate (CARAFATE) 1 GM/10ML suspension; Take 10 mLs (1 g total) by mouth 4 (four) times daily -  with meals and at bedtime for 10 days.  Dispense: 400 mL;  Refill: 0 - pantoprazole (PROTONIX) 40 MG tablet; Take 1 tablet (40 mg total) by mouth 2 (two) times daily before a meal.  Dispense: 120 tablet; Refill: 0 - patient will follow up in 6 weeks, if no improvement will refer back to GI.   2. Immunization due - Flu Vaccine Trivalent High Dose (Fluad) - Pneumococcal conjugate vaccine 20-valent    Orders Placed This Encounter  Procedures   Flu Vaccine Trivalent High Dose (Fluad)   Pneumococcal conjugate vaccine 20-valent   No images are attached to the encounter or orders placed in the encounter. Meds ordered this encounter  Medications   sucralfate (CARAFATE) 1 GM/10ML suspension    Sig: Take 10 mLs (1 g total) by mouth 4 (four) times daily -  with meals and at bedtime for 10 days.    Dispense:  400 mL    Refill:  0    Order Specific Question:  Supervising Provider    Answer:   Garnette Gunner [[id number]]   pantoprazole (PROTONIX) 40 MG tablet    Sig: Take 1 tablet (40 mg total) by mouth 2 (two) times daily before a meal.    Dispense:  120 tablet    Refill:  0    Order Specific Question:   Supervising Provider    Answer:   Garnette Gunner [[id number]]    Return in about 6 weeks (around 01/19/2023) for Acid Reflux.   Salvatore Decent, FNP

## 2022-12-08 ENCOUNTER — Encounter: Payer: Self-pay | Admitting: Internal Medicine

## 2022-12-08 ENCOUNTER — Ambulatory Visit (INDEPENDENT_AMBULATORY_CARE_PROVIDER_SITE_OTHER): Payer: Medicare Other | Admitting: Internal Medicine

## 2022-12-08 VITALS — BP 120/68 | HR 62 | Temp 98.4°F | Ht 59.0 in | Wt 131.0 lb

## 2022-12-08 DIAGNOSIS — Z23 Encounter for immunization: Secondary | ICD-10-CM | POA: Diagnosis not present

## 2022-12-08 DIAGNOSIS — D696 Thrombocytopenia, unspecified: Secondary | ICD-10-CM

## 2022-12-08 DIAGNOSIS — K219 Gastro-esophageal reflux disease without esophagitis: Secondary | ICD-10-CM

## 2022-12-08 MED ORDER — PANTOPRAZOLE SODIUM 40 MG PO TBEC: 40.0000 mg | DELAYED_RELEASE_TABLET | Freq: Two times a day (BID) | ORAL | 0 refills | Status: DC

## 2022-12-08 MED ORDER — SUCRALFATE 1 GM/10ML PO SUSP: 1.0000 g | Freq: Three times a day (TID) | ORAL | 0 refills | Status: DC

## 2022-12-11 ENCOUNTER — Ambulatory Visit
Admission: RE | Admit: 2022-12-11 | Discharge: 2022-12-11 | Disposition: A | Discharge status: Home / Self Care | Payer: Medicare Other | Source: Ambulatory Visit | Attending: Internal Medicine | Admitting: Internal Medicine

## 2022-12-11 DIAGNOSIS — Z1231 Encounter for screening mammogram for malignant neoplasm of breast: Secondary | ICD-10-CM

## 2023-01-22 NOTE — Progress Notes (Signed)
[facility] [address] [place] [address] Dept: [phone] Dept Fax: [phone]    Subjective:   Beth Brooks Jun 18, 1955 01/23/2023  No chief complaint on file.  Due to language barrier, a medical interpreter was present during the HPI, ROS, and discussion for the plan of care.  Interpreter: ***   HPI: Elton Sin presents today for re-assessment and management of chronic medical conditions.  GERD: Samirra Molinar presents for the medical management of GERD.  Current medication: Protonix 40mg  BID , Carafate QID x 10 days Well controlled: ***   Heartburn frequency: *** Heartburn duration: *** Alleviatiating factors:  *** Aggravating factors: ***  Abdominal pain: *** Dysphagia:  Nausea/Vomiting: *** Hematemesis: ***  Blood in stool: *** Alcohol use: ***  Recently checked for h. Pylori from stool specimen - none detected.  Recent EGD: yes, 03/14/21.  Was followed by [facility] in 2022.  Did have US abdomen in 05/22 : fatty liver seen    The following portions of the patient's history were reviewed and updated as appropriate: past medical history, past surgical history, family history, social history, allergies, medications, and problem list.   Patient Active Problem List   Diagnosis Date Noted   Fatty liver 09/08/2022   Cervical pain (neck) 09/08/2022   Mass of chest wall, left 04/10/2021   Chest pain 08/05/2020   Language barrier 08/05/2020   Rectal bleeding 07/20/2020   Abnormal TSH 01/24/2019   Positive ANA (antinuclear antibody) 01/24/2019   Thrombocytopenia (HCC) 01/24/2019   Overweight 01/11/2017   GERD (gastroesophageal reflux disease) 01/17/2012   Past Medical History:  Diagnosis Date   Allergy    Chest pain    Gastritis    GERD (gastroesophageal reflux disease)    SOB (shortness of breath)    Thrombocytopenia (HCC)    Past Surgical History:  Procedure Laterality  Date   TUBAL LIGATION  1994   Family History  Problem Relation Age of Onset   Gestational diabetes Daughter    Alcohol abuse Brother    Cerebrovascular Accident Brother        "blood clot on brain"  Ok now.   Breast cancer Neg Hx     Current Outpatient Medications:    acetaminophen (TYLENOL) 325 MG tablet, Take 650 mg by mouth daily., Disp: , Rfl:    albuterol (VENTOLIN HFA) 108 (90 Base) MCG/ACT inhaler, Inhale 1-2 puffs into the lungs every 6 (six) hours as needed for wheezing or shortness of breath. (Patient not taking: Reported on 12/08/2022), Disp: 18 each, Rfl: 0   alum & mag hydroxide-simeth (MYLANTA MAXIMUM STRENGTH) 400-400-40 MG/5ML suspension, Take 15 mLs by mouth as needed for indigestion., Disp: 355 mL, Rfl: 0   cetirizine (ZYRTEC) 10 MG tablet, Take 1 tablet (10 mg total) by mouth daily. (Patient not taking: Reported on 06/07/2022), Disp: 30 tablet, Rfl: 0   fluticasone (FLONASE) 50 MCG/ACT nasal spray, Place 1 spray into both nostrils daily., Disp: 9.9 mL, Rfl: 2   hydrOXYzine (ATARAX) 25 MG tablet, Take 1 tablet (25 mg total) by mouth every 8 (eight) hours as needed for itching. (Patient not taking: Reported on 06/07/2022), Disp: 12 tablet, Rfl: 0   lidocaine (XYLOCAINE) 2 % solution, Use as directed 15 mLs in the mouth or throat every 6 (six) hours as needed for mouth pain. (Patient not taking: Reported on 06/07/2022), Disp: 200 mL, Rfl: 0   meloxicam (MOBIC) 15 MG tablet, Take 1 tablet (15 mg total)  by mouth daily. (Patient not taking: Reported on 06/07/2022), Disp: 10 tablet, Rfl: 0   nitroGLYCERIN (NITROSTAT) 0.4 MG SL tablet, Place 1 tablet (0.4 mg total) under the tongue every 5 (five) minutes as needed for chest pain. (Patient not taking: Reported on 05/18/2021), Disp: 30 tablet, Rfl: 0   pantoprazole (PROTONIX) 40 MG tablet, Take 1 tablet (40 mg total) by mouth 2 (two) times daily before a meal., Disp: 120 tablet, Rfl: 0   sucralfate (CARAFATE) 1 GM/10ML suspension, Take 10  mLs (1 g total) by mouth 4 (four) times daily -  with meals and at bedtime for 10 days., Disp: 400 mL, Rfl: 0 No Known Allergies   ROS: A complete ROS was performed with pertinent positives/negatives noted in the HPI. The remainder of the ROS are negative.    Objective:   There were no vitals filed for this visit.  GENERAL: Well-appearing, in NAD. Well nourished.  SKIN: Pink, warm and dry.  NECK: Trachea midline. Full ROM w/o pain or tenderness. No lymphadenopathy.  RESPIRATORY: Chest wall symmetrical. Respirations even and non-labored. Breath sounds clear to auscultation bilaterally.  CARDIAC: S1, S2 present, regular rate and rhythm. Peripheral pulses 2+ bilaterally.  GI: Abdomen soft, non-tender. Normoactive bowel sounds. No rebound tenderness. No hepatomegaly or splenomegaly. No CVA tenderness.  EXTREMITIES: Without clubbing, cyanosis, or edema.  PSYCH/MENTAL STATUS: Alert, oriented x 3. Cooperative, appropriate mood and affect.   Health Maintenance Due  Topic Date Due   COVID-19 Vaccine (4 - 2023-24 season) 11/26/2022    No results found for any visits on 01/23/23.  The 10-year ASCVD risk score (Arnett DK, et al., 2019) is: 4.8%     Assessment & Plan:   There are no diagnoses linked to this encounter. No orders of the defined types were placed in this encounter.  No images are attached to the encounter or orders placed in the encounter. No orders of the defined types were placed in this encounter.   No follow-ups on file.   Salvatore Decent, FNP

## 2023-01-23 ENCOUNTER — Encounter: Payer: Self-pay | Admitting: Internal Medicine

## 2023-01-23 ENCOUNTER — Ambulatory Visit (INDEPENDENT_AMBULATORY_CARE_PROVIDER_SITE_OTHER): Payer: Medicare Other | Admitting: Internal Medicine

## 2023-01-23 VITALS — BP 120/80 | HR 66 | Temp 98.3°F | Ht 59.0 in | Wt 126.0 lb

## 2023-01-23 DIAGNOSIS — K644 Residual hemorrhoidal skin tags: Secondary | ICD-10-CM | POA: Diagnosis not present

## 2023-01-23 DIAGNOSIS — K5900 Constipation, unspecified: Secondary | ICD-10-CM

## 2023-01-23 DIAGNOSIS — R1013 Epigastric pain: Secondary | ICD-10-CM

## 2023-01-23 DIAGNOSIS — K921 Melena: Secondary | ICD-10-CM | POA: Diagnosis not present

## 2023-01-23 DIAGNOSIS — K219 Gastro-esophageal reflux disease without esophagitis: Secondary | ICD-10-CM

## 2023-01-23 DIAGNOSIS — Z23 Encounter for immunization: Secondary | ICD-10-CM

## 2023-01-23 DIAGNOSIS — R0602 Shortness of breath: Secondary | ICD-10-CM

## 2023-01-23 LAB — COMPREHENSIVE METABOLIC PANEL
ALT: 12 U/L (ref 0–35)
AST: 16 U/L (ref 0–37)
Albumin: 4.5 g/dL (ref 3.5–5.2)
Alkaline Phosphatase: 49 U/L (ref 39–117)
BUN: 22 mg/dL (ref 6–23)
CO2: 30 meq/L (ref 19–32)
Calcium: 9.5 mg/dL (ref 8.4–10.5)
Chloride: 104 meq/L (ref 96–112)
Creatinine, Ser: 0.72 mg/dL (ref 0.40–1.20)
GFR: 87.49 mL/min (ref >60.00)
Glucose, Bld: 86 mg/dL (ref 70–99)
Potassium: 3.8 meq/L (ref 3.5–5.1)
Sodium: 141 meq/L (ref 135–145)
Total Bilirubin: 0.5 mg/dL (ref 0.2–1.2)
Total Protein: 7.6 g/dL (ref 6.0–8.3)

## 2023-01-23 LAB — CBC WITH DIFFERENTIAL/PLATELET
Basophils Absolute: 0 10*3/uL (ref 0.0–0.1)
Basophils Relative: 0.3 % (ref 0.0–3.0)
Eosinophils Absolute: 0.2 10*3/uL (ref 0.0–0.7)
Eosinophils Relative: 4.3 % (ref 0.0–5.0)
HCT: 45.1 % (ref 36.0–46.0)
Hemoglobin: 14.5 g/dL (ref 12.0–15.0)
Lymphocytes Relative: 40.2 % (ref 12.0–46.0)
Lymphs Abs: 2.3 10*3/uL (ref 0.7–4.0)
MCHC: 32.1 g/dL (ref 30.0–36.0)
MCV: 89.9 fL (ref 78.0–100.0)
Monocytes Absolute: 0.3 10*3/uL (ref 0.1–1.0)
Monocytes Relative: 5.1 % (ref 3.0–12.0)
Neutro Abs: 2.8 10*3/uL (ref 1.4–7.7)
Neutrophils Relative %: 50.1 % (ref 43.0–77.0)
Platelets: 133 10*3/uL — ABNORMAL LOW (ref 150.0–400.0)
RBC: 5.02 Mil/uL (ref 3.87–5.11)
RDW: 13.6 % (ref 11.5–15.5)
WBC: 5.7 10*3/uL (ref 4.0–10.5)

## 2023-01-23 LAB — POCT OCCULT BLOOD STOOL, DEVICE

## 2023-01-23 MED ORDER — HYDROCORTISONE (PERIANAL) 2.5 % EX CREA
1.0000 | TOPICAL_CREAM | Freq: Two times a day (BID) | CUTANEOUS | 1 refills | Status: AC | PRN
Start: 2023-01-23 — End: ?

## 2023-01-23 MED ORDER — PANTOPRAZOLE SODIUM 40 MG PO TBEC
40.0000 mg | DELAYED_RELEASE_TABLET | Freq: Every day | ORAL | Status: DC
Start: 2023-01-23 — End: 2023-03-05

## 2023-01-23 MED ORDER — ALBUTEROL SULFATE HFA 108 (90 BASE) MCG/ACT IN AERS: 1.0000 | INHALATION_SPRAY | Freq: Four times a day (QID) | RESPIRATORY_TRACT | 2 refills | Status: DC | PRN

## 2023-01-23 MED ORDER — DOCUSATE SODIUM 100 MG PO CAPS
100.0000 mg | ORAL_CAPSULE | Freq: Two times a day (BID) | ORAL | 1 refills | Status: AC | PRN
Start: 2023-01-23 — End: 2023-03-24

## 2023-01-26 ENCOUNTER — Ambulatory Visit
Admission: RE | Admit: 2023-01-26 | Discharge: 2023-01-26 | Disposition: A | Discharge status: Home / Self Care | Payer: Medicare Other | Source: Ambulatory Visit | Attending: Internal Medicine | Admitting: Internal Medicine

## 2023-01-26 DIAGNOSIS — R1013 Epigastric pain: Secondary | ICD-10-CM

## 2023-02-21 ENCOUNTER — Encounter (HOSPITAL_COMMUNITY): Payer: Self-pay

## 2023-02-21 ENCOUNTER — Ambulatory Visit (HOSPITAL_COMMUNITY)
Admission: EM | Admit: 2023-02-21 | Discharge: 2023-02-21 | Disposition: A | Discharge status: Home / Self Care | Payer: Medicare Other | Attending: Internal Medicine | Admitting: Internal Medicine

## 2023-02-21 DIAGNOSIS — J218 Acute bronchiolitis due to other specified organisms: Secondary | ICD-10-CM

## 2023-02-21 DIAGNOSIS — B9789 Other viral agents as the cause of diseases classified elsewhere: Secondary | ICD-10-CM | POA: Diagnosis not present

## 2023-02-21 MED ORDER — FLUTICASONE PROPIONATE 50 MCG/ACT NA SUSP: 1.0000 | Freq: Every day | NASAL | 0 refills | Status: DC

## 2023-02-21 MED ORDER — BENZONATATE 100 MG PO CAPS: 200.0000 mg | ORAL_CAPSULE | Freq: Three times a day (TID) | ORAL | 0 refills | Status: DC | PRN

## 2023-02-21 MED ORDER — CETIRIZINE HCL 10 MG PO TABS: 10.0000 mg | ORAL_TABLET | Freq: Every day | ORAL | 1 refills | Status: DC

## 2023-02-21 NOTE — ED Provider Notes (Signed)
[facility]    CSN: [id number] Arrival date & time: 02/21/23  0813      History   Chief Complaint Chief Complaint  Patient presents with   Cough    HPI Beth Brooks is a 67 y.o. female comes to urgent care with 7 to 10-day history of nonproductive cough, nasal congestion and postnasal drainage.  Patient symptoms started insidiously and has been persistent.  Cough is not productive of sputum and it is associated with itchy throat and itchy ears.  She denies any history of seasonal allergies although her records state that.  No shortness of breath with physical activity.  No wheezing.  No sick contacts.  He has occasional chills with no fever.   HPI  Past Medical History:  Diagnosis Date   Allergy    Chest pain    Gastritis    GERD (gastroesophageal reflux disease)    SOB (shortness of breath)    Thrombocytopenia (HCC)     Patient Active Problem List   Diagnosis Date Noted   Fatty liver 09/08/2022   Cervical pain (neck) 09/08/2022   Mass of chest wall, left 04/10/2021   Chest pain 08/05/2020   Language barrier 08/05/2020   Rectal bleeding 07/20/2020   Abnormal TSH 01/24/2019   Positive ANA (antinuclear antibody) 01/24/2019   Thrombocytopenia (HCC) 01/24/2019   Overweight 01/11/2017   GERD (gastroesophageal reflux disease) 01/17/2012    Past Surgical History:  Procedure Laterality Date   TUBAL LIGATION  1994    OB History   No obstetric history on file.      Home Medications    Prior to Admission medications   Medication Sig Start Date End Date Taking? Authorizing Provider  benzonatate (TESSALON) 100 MG capsule Take 2 capsules (200 mg total) by mouth 3 (three) times daily as needed for cough. 02/21/23  Yes Nasean Zapf, Britta Mccreedy, MD  cetirizine (ZYRTEC ALLERGY) 10 MG tablet Take 1 tablet (10 mg total) by mouth daily. 02/21/23  Yes Travelle Mcclimans, Britta Mccreedy, MD  fluticasone (FLONASE) 50 MCG/ACT nasal spray Place 1 spray into both nostrils daily. 02/21/23   Yes Azlin Zilberman, Britta Mccreedy, MD  acetaminophen (TYLENOL) 325 MG tablet Take 650 mg by mouth daily.    [provider]  albuterol (VENTOLIN HFA) 108 (90 Base) MCG/ACT inhaler Inhale 1-2 puffs into the lungs every 6 (six) hours as needed for wheezing or shortness of breath. 01/23/23   Salvatore Decent, FNP  docusate sodium (COLACE) 100 MG capsule Take 1 capsule (100 mg total) by mouth 2 (two) times daily as needed (constipation). 01/23/23 03/24/23  Salvatore Decent, FNP  hydrocortisone (ANUSOL-HC) 2.5 % rectal cream Place 1 Application rectally 2 (two) times daily as needed for hemorrhoids or anal itching. 01/23/23   Salvatore Decent, FNP  pantoprazole (PROTONIX) 40 MG tablet Take 1 tablet (40 mg total) by mouth daily. 01/23/23 03/24/23  Salvatore Decent, FNP    Family History Family History  Problem Relation Age of Onset   Gestational diabetes Daughter    Alcohol abuse Brother    Cerebrovascular Accident Brother        "blood clot on brain"  Ok now.   Breast cancer Neg Hx     Social History Social History   Tobacco Use   Smoking status: Never    Passive exposure: Never   Smokeless tobacco: Never  Vaping Use   Vaping status: Never Used  Substance Use Topics   Alcohol use: No   Drug use: No  Allergies   Patient has no known allergies.   Review of Systems Review of Systems As per HPI  Physical Exam Triage Vital Signs ED Triage Vitals  Encounter Vitals Group     BP 02/21/23 0930 112/75     Systolic BP Percentile --      Diastolic BP Percentile --      Pulse Rate 02/21/23 0930 70     Resp 02/21/23 0930 16     Temp 02/21/23 0930 98.2 F (36.8 C)     Temp Source 02/21/23 0930 Oral     SpO2 02/21/23 0930 98 %     Weight 02/21/23 0930 128 lb (58.1 kg)     Height 02/21/23 0930 5' (1.524 m)     Head Circumference --      Peak Flow --      Pain Score 02/21/23 0928 10     Pain Loc --      Pain Education --      Exclude from Growth Chart --    No data found.  Updated  Vital Signs BP 112/75 (BP Location: Left Arm)   Pulse 70   Temp 98.2 F (36.8 C) (Oral)   Resp 16   Ht 5' (1.524 m)   Wt 58.1 kg   LMP 01/12/2012   SpO2 98%   BMI 25.00 kg/m   Visual Acuity Right Eye Distance:   Left Eye Distance:   Bilateral Distance:    Right Eye Near:   Left Eye Near:    Bilateral Near:     Physical Exam Vitals and nursing note reviewed.  Constitutional:      General: She is not in acute distress.    Appearance: She is not ill-appearing.  HENT:     Right Ear: Tympanic membrane normal.     Left Ear: Tympanic membrane normal.     Mouth/Throat:     Mouth: Mucous membranes are moist.     Pharynx: No oropharyngeal exudate or posterior oropharyngeal erythema.  Cardiovascular:     Rate and Rhythm: Normal rate and regular rhythm.     Pulses: Normal pulses.     Heart sounds: Normal heart sounds.  Pulmonary:     Effort: Pulmonary effort is normal.     Breath sounds: Normal breath sounds. No wheezing, rhonchi or rales.  Abdominal:     General: Bowel sounds are normal.     Palpations: Abdomen is soft.  Neurological:     Mental Status: She is alert.      UC Treatments / Results  Labs (all labs ordered are listed, but only abnormal results are displayed) Labs Reviewed - No data to display  EKG   Radiology No results found.  Procedures Procedures (including critical care time)  Medications Ordered in UC Medications - No data to display  Initial Impression / Assessment and Plan / UC Course  I have reviewed the triage vital signs and the nursing notes.  Pertinent labs & imaging results that were available during my care of the patient were reviewed by me and considered in my medical decision making (see chart for details).     1.  Acute viral bronchitis: Lung exam is reassuring Tessalon Perles as needed for cough Zyrtec 10 mg orally daily Fluticasone nasal sprays Patient advised to use humidifier and VapoRub to help with nasal  congestion and postnasal drainage No indication for chest x-ray at this time Return precautions given. Final Clinical Impressions(s) / UC Diagnoses   Final diagnoses:  Acute viral bronchiolitis     Discharge Instructions      My t?o ?? ?m v s? d?ng VapoRub s? gip thot n??c m?i sau v tr? ho Hy dng thu?c theo h??ng d?n Hy duy tr ?? n??c K?t qu? khm ph?i c?a b?n th?t yn tm Quay tr? l?i ch?m [place] kh?n c?p n?u b?n c cc tri?u ch?ng x?u ?i.  Humidifier and VapoRub use will help with postnasal drainage and cough Please take medications as directed Please maintain adequate hydration Your lung exam is reassuring Return to urgent care if you have worsening symptoms.   ED Prescriptions     Medication Sig Dispense Auth. Provider   benzonatate (TESSALON) 100 MG capsule Take 2 capsules (200 mg total) by mouth 3 (three) times daily as needed for cough. 21 capsule Rashied Corallo, Britta Mccreedy, MD   cetirizine (ZYRTEC ALLERGY) 10 MG tablet Take 1 tablet (10 mg total) by mouth daily. 30 tablet Taneeka Curtner, Britta Mccreedy, MD   fluticasone (FLONASE) 50 MCG/ACT nasal spray Place 1 spray into both nostrils daily. 16 g Shacarra Choe, Britta Mccreedy, MD      PDMP not reviewed this encounter.   Merrilee Jansky, MD 02/21/23 [phone]

## 2023-02-21 NOTE — Discharge Instructions (Addendum)
My t?o ?? ?m v s? d?ng VapoRub s? gip thot n??c m?i sau v tr? ho Hy dng thu?c theo h??ng d?n Hy duy tr ?? n??c K?t qu? khm ph?i c?a b?n th?t yn tm Quay tr? l?i ch?m [place] kh?n c?p n?u b?n c cc tri?u ch?ng x?u ?i.  Humidifier and VapoRub use will help with postnasal drainage and cough Please take medications as directed Please maintain adequate hydration Your lung exam is reassuring Return to urgent care if you have worsening symptoms.

## 2023-02-21 NOTE — ED Triage Notes (Signed)
Patient here today with c/o cough, runny nose, congestion, and chills X 1.5 weeks. She has been taking Mucinex with no relief. No sick contacts.

## 2023-02-26 ENCOUNTER — Ambulatory Visit: Payer: Medicare Other

## 2023-02-26 ENCOUNTER — Ambulatory Visit: Payer: Medicare Other | Admitting: Internal Medicine

## 2023-02-26 VITALS — BP 110/60 | HR 82 | Temp 98.5°F | Ht 59.0 in | Wt 124.8 lb

## 2023-02-26 DIAGNOSIS — Z0000 Encounter for general adult medical examination without abnormal findings: Secondary | ICD-10-CM

## 2023-02-26 DIAGNOSIS — E2839 Other primary ovarian failure: Secondary | ICD-10-CM

## 2023-02-26 NOTE — Patient Instructions (Signed)
Beth Brooks , Thank you for taking time to come for your Medicare Wellness Visit. I appreciate your ongoing commitment to your health goals. Please review the following plan we discussed and let me know if I can assist you in the future.   Referrals/Orders/Follow-Ups/Clinician Recommendations: bone density  You have an order for:  []   2D Mammogram  []   3D Mammogram  [x]   Bone Density     Please call for appointment:  [facility] [address] [place], [place] [address] [phone]     Make sure to wear two-piece clothing.  No lotions, powders, or deodorants the day of the appointment. Make sure to bring picture ID and insurance card.  Bring list of medications you are currently taking including any supplements.   Schedule your [place] screening mammogram through MyChart!   Log into your MyChart account.  Go to 'Visit' (or 'Appointments' if on mobile App) --> Schedule an Appointment  Under 'Select a Reason for Visit' choose the Mammogram Screening option.  Complete the pre-visit questions and select the time and place that best fits your schedule.    This is a list of the screening recommended for you and due dates:  Health Maintenance  Topic Date Due   DEXA scan (bone density measurement)  03/10/2023*   COVID-19 Vaccine (4 - 2023-24 season) 07/24/2023*   Medicare Annual Wellness Visit  02/26/2024   Mammogram  12/10/2024   Colon Cancer Screening  03/14/2026   Pap with HPV screening  01/11/2027   DTaP/Tdap/Td vaccine (2 - Td or Tdap) 01/23/2029   Pneumonia Vaccine  Completed   Flu Shot  Completed   Hepatitis C Screening  Completed   HIV Screening  Completed   Zoster (Shingles) Vaccine  Completed   HPV Vaccine  Aged Out  *Topic was postponed. The date shown is not the original due date.    Advanced directives: (Declined) Advance directive discussed with you today. Even though you declined this today, please call our office should you  change your mind, and we can give you the proper paperwork for you to fill out.  Next Medicare Annual Wellness Visit scheduled for next year: Yes  Insert Preventive Care attachment Insert FALL PREVENTION attachment if needed

## 2023-02-26 NOTE — Progress Notes (Signed)
Subjective:   Beth Brooks is a 67 y.o. female who presents for an Initial Medicare Annual Wellness Visit.  Visit Complete: In person    Cardiac Risk Factors include: advanced age (>26men, >59 women)     Objective:    Today's Vitals   02/26/23 1454 02/26/23 1455  BP: 110/60   Pulse: 82   Temp: 98.5 F (36.9 C)   TempSrc: Oral   SpO2: 96%   Weight: 124 lb 12.8 oz (56.6 kg)   Height: 4\' 11"  (1.499 m)   PainSc:  8    Body mass index is 25.21 kg/m.     02/26/2023    3:05 PM 11/03/2022    2:36 PM 01/10/2022    8:38 AM 06/29/2021    8:31 PM 08/03/2020    9:49 AM 07/20/2020    3:05 PM  Advanced Directives  Does Patient Have a Medical Advance Directive? No No No No No No  Would patient like information on creating a medical advance directive? No - Patient declined No - Patient declined  No - Patient declined No - Patient declined No - Patient declined    Current Medications (verified) Outpatient Encounter Medications as of 02/26/2023  Medication Sig   acetaminophen (TYLENOL) 325 MG tablet Take 650 mg by mouth daily.   albuterol (VENTOLIN HFA) 108 (90 Base) MCG/ACT inhaler Inhale 1-2 puffs into the lungs every 6 (six) hours as needed for wheezing or shortness of breath.   benzonatate (TESSALON) 100 MG capsule Take 2 capsules (200 mg total) by mouth 3 (three) times daily as needed for cough.   cetirizine (ZYRTEC ALLERGY) 10 MG tablet Take 1 tablet (10 mg total) by mouth daily.   docusate sodium (COLACE) 100 MG capsule Take 1 capsule (100 mg total) by mouth 2 (two) times daily as needed (constipation).   fluticasone (FLONASE) 50 MCG/ACT nasal spray Place 1 spray into both nostrils daily.   hydrocortisone (ANUSOL-HC) 2.5 % rectal cream Place 1 Application rectally 2 (two) times daily as needed for hemorrhoids or anal itching.   pantoprazole (PROTONIX) 40 MG tablet Take 1 tablet (40 mg total) by mouth daily.   No facility-administered encounter medications on file as of  02/26/2023.    Allergies (verified) Patient has no known allergies.   History: Past Medical History:  Diagnosis Date   Allergy    Chest pain    Gastritis    GERD (gastroesophageal reflux disease)    SOB (shortness of breath)    Thrombocytopenia (HCC)    Past Surgical History:  Procedure Laterality Date   TUBAL LIGATION  1994   Family History  Problem Relation Age of Onset   Gestational diabetes Daughter    Alcohol abuse Brother    Cerebrovascular Accident Brother        "blood clot on brain"  Ok now.   Breast cancer Neg Hx    Social History   Socioeconomic History   Marital status: Widowed    Spouse name: Renae Fickle Adup   Number of children: 8   Years of education: 8   Highest education level: Not on file  Occupational History   Occupation: Housewife  Tobacco Use   Smoking status: Never    Passive exposure: Never   Smokeless tobacco: Never  Vaping Use   Vaping status: Never Used  Substance and Sexual Activity   Alcohol use: No   Drug use: No   Sexual activity: Not Currently    Comment: married  Other Topics Concern  Not on file  Social History Narrative   Originally from [place]   [place]   Came to [employer]. In 1999   Lives with her husband and one daughter.   Husband with hepatic carcinoma, for which he has been treated past 3 years.     She has support to give her time off.   Son who interprets takes her husband for chemo twice weekly.   Social Determinants of Health   Financial Resource Strain: Low Risk  (02/26/2023)   Overall Financial Resource Strain (CARDIA)    Difficulty of Paying Living Expenses: Not hard at all  Food Insecurity: No Food Insecurity (02/26/2023)   Hunger Vital Sign    Worried About Running Out of Food in the Last Year: Never true    Ran Out of Food in the Last Year: Never true  Transportation Needs: No Transportation Needs (02/26/2023)   PRAPARE - Administrator, Civil Service (Medical): No    Lack of Transportation  (Non-Medical): No  Physical Activity: Sufficiently Active (02/26/2023)   Exercise Vital Sign    Days of Exercise per Week: 7 days    Minutes of Exercise per Session: 30 min  Stress: No Stress Concern Present (02/26/2023)   [employer] of Occupational Health - Occupational Stress Questionnaire    Feeling of Stress : Not at all  Social Connections: Moderately Isolated (02/26/2023)   Social Connection and Isolation Panel [NHANES]    Frequency of Communication with Friends and Family: More than three times a week    Frequency of Social Gatherings with Friends and Family: Three times a week    Attends Religious Services: More than 4 times per year    Active Member of Clubs or Organizations: No    Attends Banker Meetings: Never    Marital Status: Widowed    Tobacco Counseling Counseling given: Not Answered   Clinical Intake:  Pre-visit preparation completed: Yes  Pain : 0-10 Pain Score: 8  Pain Type: Chronic pain Pain Location: Back Pain Orientation: Lower Pain Radiating Towards: down to thighs Pain Descriptors / Indicators: Aching Pain Onset: More than a month ago Pain Frequency: Intermittent     Nutritional Status: BMI 25 -29 Overweight Nutritional Risks: None Diabetes: No  How often do you need to have someone help you when you read instructions, pamphlets, or other written materials from your doctor or pharmacy?: 4 - Often  Interpreter Needed?: Yes Interpreter Agency: [facility] Interpreter Name: Tresa Endo  Information entered by :: NAllen LPN   Activities of Daily Living    02/26/2023    2:58 PM 11/03/2022    2:30 PM  In your present state of health, do you have any difficulty performing the following activities:  Hearing? 0 0  Vision? 0 0  Difficulty concentrating or making decisions? 0 0  Walking or climbing stairs? 1 0  Dressing or bathing? 1 0  Comment pain in lower back   Doing errands, shopping? 0 0  Preparing Food and eating ? N N  Using the  Toilet? N N  In the past six months, have you accidently leaked urine? N N  Do you have problems with loss of bowel control? N N  Managing your Medications? N N  Managing your Finances? N N  Housekeeping or managing your Housekeeping? N N    Patient Care Team: Salvatore Decent, FNP as PCP - General (Internal Medicine) Janeece Agee, NP as PCP - Family Medicine (Adult Health Nurse Practitioner) Corky Crafts,  MD as PCP - Cardiology (Cardiology)  Indicate any recent Medical Services you may have received from other than [facility] providers in the past year (date may be approximate).     Assessment:   This is a routine wellness examination for [place].  Hearing/Vision screen Hearing Screening - Comments:: Denies hearing issues Vision Screening - Comments:: Regular eye exams, [facility]   Goals Addressed             This Visit's Progress    Patient Stated       02/26/2023, stay healthy       Depression Screen    02/26/2023    3:07 PM 01/23/2023    9:25 AM 11/03/2022    2:39 PM 09/08/2022   10:53 AM 01/10/2022    8:38 AM 04/06/2021    3:54 PM 08/03/2020    9:48 AM  PHQ 2/9 Scores  PHQ - 2 Score 0 0 0 2 1 0 0  PHQ- 9 Score 3  1 6 4 1  0    Fall Risk    02/26/2023    3:06 PM 01/23/2023    9:25 AM 11/03/2022    2:38 PM 09/08/2022    9:20 AM 08/03/2020    9:50 AM  Fall Risk   Falls in the past year? 0 0 0 0 0  Number falls in past yr: 0 0 0 0   Injury with Fall? 0 0 0 0   Risk for fall due to : Medication side effect No Fall Risks Medication side effect No Fall Risks   Follow up Falls prevention discussed;Falls evaluation completed Falls prevention discussed Falls prevention discussed;Falls evaluation completed Falls prevention discussed     MEDICARE RISK AT HOME: Medicare Risk at Home Any stairs in or around the home?: No If so, are there any without handrails?: No Home free of loose throw rugs in walkways, pet beds, electrical cords, etc?: Yes Adequate  lighting in your home to reduce risk of falls?: Yes Life alert?: No Use of a cane, walker or w/c?: No Grab bars in the bathroom?: No Shower chair or bench in shower?: Yes Elevated toilet seat or a handicapped toilet?: No  TIMED UP AND GO:  Was the test performed? Yes  Length of time to ambulate 10 feet: 5 sec Gait steady and fast without use of assistive device    Cognitive Function:  6 CIT not administered due to language barrier. Patient appeared cognitive via direct observation and conversation through interpreter.        Immunizations Immunization History  Administered Date(s) Administered   Fluad Trivalent(High Dose 65+) 12/08/2022   Influenza-Unspecified 02/18/2020, 12/25/2021   PFIZER Comirnaty(Gray Top)Covid-19 Tri-Sucrose Vaccine 07/20/2020   PFIZER(Purple Top)SARS-COV-2 Vaccination 06/10/2019, 06/30/2019   PNEUMOCOCCAL CONJUGATE-20 12/08/2022   Tdap 01/24/2019   Zoster Recombinant(Shingrix) 09/08/2022, 01/10/2023    TDAP status: Up to date  Flu Vaccine status: Up to date  Pneumococcal vaccine status: Up to date  Covid-19 vaccine status: Information provided on how to obtain vaccines.   Qualifies for Shingles Vaccine? Yes   Zostavax completed Yes   Shingrix Completed?: Yes  Screening Tests Health Maintenance  Topic Date Due   DEXA SCAN  03/10/2023 (Originally 03/26/2022)   COVID-19 Vaccine (4 - 2023-24 season) 07/24/2023 (Originally 11/26/2022)   Medicare Annual Wellness (AWV)  02/26/2024   MAMMOGRAM  12/10/2024   Colonoscopy  03/14/2026   Cervical Cancer Screening (HPV/Pap Cotest)  01/11/2027   DTaP/Tdap/Td (2 - Td or Tdap) 01/23/2029  Pneumonia Vaccine 92+ Years old  Completed   INFLUENZA VACCINE  Completed   Hepatitis C Screening  Completed   HIV Screening  Completed   Zoster Vaccines- Shingrix  Completed   HPV VACCINES  Aged Out    Health Maintenance  There are no preventive care reminders to display for this patient.  Colorectal cancer  screening: Type of screening: Colonoscopy. Completed 03/14/2021. Repeat every 5 years  Mammogram status: Completed 12/11/2022. Repeat every year  Bone Density status: Ordered today. Pt provided with contact info and advised to call to schedule appt.  Lung Cancer Screening: (Low Dose CT Chest recommended if Age 52-80 years, 20 pack-year currently smoking OR have quit w/in 15years.) does not qualify.   Lung Cancer Screening Referral: no  Additional Screening:  Hepatitis C Screening: does qualify; Completed 04/30/2020  Vision Screening: Recommended annual ophthalmology exams for early detection of glaucoma and other disorders of the eye. Is the patient up to date with their annual eye exam?  Yes  Who is the provider or what is the name of the office in which the patient attends annual eye exams? [facility] If pt is not established with a provider, would they like to be referred to a provider to establish care? No .   Dental Screening: Recommended annual dental exams for proper oral hygiene  Diabetic Foot Exam: n/a  Community Resource Referral / Chronic Care Management: CRR required this visit?  No   CCM required this visit?  No     Plan:     I have personally reviewed and noted the following in the patient's chart:   Medical and social history Use of alcohol, tobacco or illicit drugs  Current medications and supplements including opioid prescriptions. Patient is not currently taking opioid prescriptions. Functional ability and status Nutritional status Physical activity Advanced directives List of other physicians Hospitalizations, surgeries, and ER visits in previous 12 months Vitals Screenings to include cognitive, depression, and falls Referrals and appointments  In addition, I have reviewed and discussed with patient certain preventive protocols, quality metrics, and best practice recommendations. A written personalized care plan for preventive services as well as  general preventive health recommendations were provided to patient.     Barb Merino, LPN   16/03/958   After Visit Summary: (In Person-Printed) AVS printed and given to the patient  Nurse Notes: none

## 2023-02-28 ENCOUNTER — Encounter: Payer: Self-pay | Admitting: Internal Medicine

## 2023-02-28 ENCOUNTER — Ambulatory Visit: Payer: Medicare Other | Admitting: Internal Medicine

## 2023-02-28 VITALS — BP 110/70 | HR 69 | Temp 98.4°F | Ht 59.0 in | Wt 123.8 lb

## 2023-02-28 DIAGNOSIS — M5442 Lumbago with sciatica, left side: Secondary | ICD-10-CM

## 2023-02-28 DIAGNOSIS — M5441 Lumbago with sciatica, right side: Secondary | ICD-10-CM | POA: Diagnosis not present

## 2023-02-28 DIAGNOSIS — J22 Unspecified acute lower respiratory infection: Secondary | ICD-10-CM | POA: Diagnosis not present

## 2023-02-28 MED ORDER — PROMETHAZINE-DM 6.25-15 MG/5ML PO SYRP
5.0000 mL | ORAL_SOLUTION | Freq: Four times a day (QID) | ORAL | 0 refills | Status: DC | PRN
Start: 2023-02-28 — End: 2023-06-11

## 2023-02-28 MED ORDER — AZITHROMYCIN 250 MG PO TABS
ORAL_TABLET | ORAL | 0 refills | Status: AC
Start: 2023-02-28 — End: 2023-03-05

## 2023-02-28 NOTE — Patient Instructions (Addendum)
Back Pain:  Continue tylenol Alternate ice and heat Do back stretches  Continue salonpas patches

## 2023-02-28 NOTE — Progress Notes (Signed)
[facility] [address] [place] [address] Dept: [phone] Dept Fax: [phone]  Acute Care Office Visit  Subjective:   Beth Brooks 15-Sep-1955 02/28/2023  Chief Complaint  Patient presents with   Cough   Back Pain   Due to language barrier, an interpreter was present during the HPI, ROS, and discussion for the plan of care.  Interpreter: daughter ly Darien Ramus ([place]   HPI: Discussed the use of AI scribe software for clinical note transcription with the patient, who gave verbal consent to proceed.  History of Present Illness   The patient, with a history of acute viral bronchitis recently diagnosed on 11/27 at [facility], presents with a persistent nonproductive cough, nasal congestion, and postnasal drainage that has been ongoing for over two weeks. Despite treatment with Tessalon Perles, Zyrtec, and Flonase nasal spray, the patient's symptoms have not improved. The patient describes the cough as severe and more pronounced at night. She also reports an itching sensation in the throat and ears.  In addition to the respiratory symptoms, the patient has been experiencing lower back pain for a month. The pain radiates down both legs and is exacerbated by sitting and standing. Over-the-counter treatments, such as Tylenol intermittently and Salonpas patches, have not provided relief. She cannot take NSAIDs due to hx of GI bleeding.      The following portions of the patient's history were reviewed and updated as appropriate: past medical history, past surgical history, family history, social history, allergies, medications, and problem list.   Patient Active Problem List   Diagnosis Date Noted   Fatty liver 09/08/2022   Cervical pain (neck) 09/08/2022   Mass of chest wall, left 04/10/2021   Chest pain 08/05/2020   Language barrier 08/05/2020   Rectal bleeding 07/20/2020   Abnormal TSH 01/24/2019   Positive ANA  (antinuclear antibody) 01/24/2019   Thrombocytopenia (HCC) 01/24/2019   Overweight 01/11/2017   GERD (gastroesophageal reflux disease) 01/17/2012   Past Medical History:  Diagnosis Date   Allergy    Chest pain    Gastritis    GERD (gastroesophageal reflux disease)    SOB (shortness of breath)    Thrombocytopenia (HCC)    Past Surgical History:  Procedure Laterality Date   TUBAL LIGATION  1994   Family History  Problem Relation Age of Onset   Gestational diabetes Daughter    Alcohol abuse Brother    Cerebrovascular Accident Brother        "blood clot on brain"  Ok now.   Breast cancer Neg Hx     Current Outpatient Medications:    acetaminophen (TYLENOL) 325 MG tablet, Take 650 mg by mouth daily., Disp: , Rfl:    albuterol (VENTOLIN HFA) 108 (90 Base) MCG/ACT inhaler, Inhale 1-2 puffs into the lungs every 6 (six) hours as needed for wheezing or shortness of breath., Disp: 18 each, Rfl: 2   azithromycin (ZITHROMAX) 250 MG tablet, Take 2 tablets on day 1, then 1 tablet daily on days 2 through 5, Disp: 6 tablet, Rfl: 0   docusate sodium (COLACE) 100 MG capsule, Take 1 capsule (100 mg total) by mouth 2 (two) times daily as needed (constipation)., Disp: 60 capsule, Rfl: 1   fluticasone (FLONASE) 50 MCG/ACT nasal spray, Place 1 spray into both nostrils daily., Disp: 16 g, Rfl: 0   hydrocortisone (ANUSOL-HC) 2.5 % rectal cream, Place 1 Application rectally 2 (two) times daily as needed for hemorrhoids or anal itching., Disp: 30 g, Rfl:  1   pantoprazole (PROTONIX) 40 MG tablet, Take 1 tablet (40 mg total) by mouth daily., Disp: , Rfl:    promethazine-dextromethorphan (PROMETHAZINE-DM) 6.25-15 MG/5ML syrup, Take 5 mLs by mouth 4 (four) times daily as needed for cough., Disp: 180 mL, Rfl: 0   benzonatate (TESSALON) 100 MG capsule, Take 2 capsules (200 mg total) by mouth 3 (three) times daily as needed for cough. (Patient not taking: Reported on 02/28/2023), Disp: 21 capsule, Rfl: 0    cetirizine (ZYRTEC ALLERGY) 10 MG tablet, Take 1 tablet (10 mg total) by mouth daily. (Patient not taking: Reported on 02/28/2023), Disp: 30 tablet, Rfl: 1 No Known Allergies   ROS: A complete ROS was performed with pertinent positives/negatives noted in the HPI. The remainder of the ROS are negative.    Objective:   Today's Vitals   02/28/23 1451  BP: 110/70  Pulse: 69  Temp: 98.4 F (36.9 C)  TempSrc: Temporal  SpO2: 98%  Weight: 123 lb 12.8 oz (56.2 kg)  Height: 4\' 11"  (1.499 m)    GENERAL: Well-appearing, in NAD. Well nourished.  SKIN: Pink, warm and dry. No rash, lesion, ulceration, or ecchymoses. HEENT:    HEAD: Normocephalic, non-traumatic.  EYES: Conjunctive pink without exudate. EARS: External ear w/o redness, swelling, masses, or lesions. EAC clear. TM's intact, translucent w/o bulging, appropriate landmarks visualized.  NOSE: Septum midline w/o deformity. Nares patent, mucosa pink and non-inflamed w/o drainage. No sinus tenderness.  THROAT: Uvula midline. Oropharynx clear. Tonsils non-inflamed w/o exudate. Mucus membranes pink and moist.  NECK: Trachea midline. Full ROM w/o pain or tenderness. No lymphadenopathy.  RESPIRATORY: Chest wall symmetrical. Respirations even and non-labored. Breath sounds clear to auscultation bilaterally. (+)cough CARDIAC: S1, S2 present, regular rate and rhythm. Peripheral pulses 2+ bilaterally.  MSK: Muscle tone and strength appropriate for age. Pain with straight leg raise bilaterally, TTP lumbar region EXTREMITIES: Without clubbing, cyanosis, or edema.  NEUROLOGIC: Steady, even gait.  PSYCH/MENTAL STATUS: Alert, oriented x 3. Cooperative, appropriate mood and affect.    No results found for any visits on 02/28/23.    Assessment & Plan:  Assessment and Plan    Lower Respiratory Infection -Prescribe Z-Pak (Azithromycin) to treat as bacterial infection due to duration of symptoms. -Prescribe Phenergan DM cough syrup, cautioning  potential drowsiness.  Lower Back Pain -Continue Tylenol as needed for pain. -Advise to alternate between ice and heat packs. -Recommend specific stretches to alleviate nerve compression. -Continue Salonpas patches. -Consider referral to orthopedics if no improvement.     Meds ordered this encounter  Medications   azithromycin (ZITHROMAX) 250 MG tablet    Sig: Take 2 tablets on day 1, then 1 tablet daily on days 2 through 5    Dispense:  6 tablet    Refill:  0    Order Specific Question:   Supervising Provider    Answer:   Garnette Gunner [[id number]]   promethazine-dextromethorphan (PROMETHAZINE-DM) 6.25-15 MG/5ML syrup    Sig: Take 5 mLs by mouth 4 (four) times daily as needed for cough.    Dispense:  180 mL    Refill:  0    Order Specific Question:   Supervising Provider    Answer:   Garnette Gunner [[id number]]   No orders of the defined types were placed in this encounter.  Lab Orders  No laboratory test(s) ordered today   No images are attached to the encounter or orders placed in the encounter.  Return if symptoms worsen or fail to improve.  Salvatore Decent, FNP

## 2023-03-04 ENCOUNTER — Other Ambulatory Visit: Payer: Self-pay | Admitting: Internal Medicine

## 2023-03-04 DIAGNOSIS — K219 Gastro-esophageal reflux disease without esophagitis: Secondary | ICD-10-CM

## 2023-05-22 ENCOUNTER — Ambulatory Visit: Payer: Medicare Other | Admitting: Physician Assistant

## 2023-05-22 ENCOUNTER — Encounter: Payer: Self-pay | Admitting: Physician Assistant

## 2023-05-22 VITALS — BP 106/68 | HR 70 | Ht 59.0 in | Wt 127.0 lb

## 2023-05-22 DIAGNOSIS — K219 Gastro-esophageal reflux disease without esophagitis: Secondary | ICD-10-CM

## 2023-05-22 MED ORDER — PANTOPRAZOLE SODIUM 40 MG PO TBEC: 40.0000 mg | DELAYED_RELEASE_TABLET | Freq: Two times a day (BID) | ORAL | 11 refills | Status: DC

## 2023-05-22 NOTE — Progress Notes (Signed)
 Addendum: Reviewed and agree with assessment and management plan. Asha Grumbine, Carie Caddy, MD

## 2023-05-22 NOTE — Progress Notes (Signed)
 Chief Complaint: Constipation and GERD  HPI:    Beth Brooks is a 68 year old [place] female with a past medical history as listed below including reflux, known to Dr. Rhea Belton, who presents to clinic today for follow-up of her constipation and GERD.    03/14/2021 colonoscopy with 2 to 3-4 mm polyps in the ascending colon, one 5 mm polyp in the transverse colon, one 5 mm polyp in the sigmoid colon and diverticulosis in the sigmoid and descending colon as well as internal hemorrhoids.  Pathology showed tubular adenomas and repeat recommended in 5 years.    03/14/2021 EGD was normal.    07/11/2021 patient seen in clinic by Willette Cluster for chronic epigastric/left upper quadrant pain.  Carafate and PPI helped but did not alleviate the pain.  Viscous lidocaine and sublingual nitroglycerin also helped.  Patient had a previous negative EGD, ultrasound, CTAP.  Labs unremarkable except for mildly elevated lipase.  (Normal pancreas on CT scan), negative stress test in May 2022.  At that time recommend continuing on a twice daily PPI and discussed a possible 24-hour pH study.  She was continued on 4 times daily Carafate as well.    Today, patient presents to clinic accompanied by her daughter and interpreter.  She explains that she is not having any further abdominal pain and her constipation is controlled with as needed MiraLAX.  She knows certain foods that cause her to be constipated and tries to avoid them.      Her biggest complaint today is of ongoing reflux symptoms.  Apparently she is only taking Pantoprazole 40 mg once daily at this point.  She has breakthrough reflux almost daily, worse with certain foods like ginger and black pepper which she tries to avoid.  Apparently her PCP had her on twice daily dosing but told her she can take this forever.  She feels like she was doing better when she was on it twice a day.    Denies fever, chills or weight loss.  Past Medical History:  Diagnosis Date    Allergy    Chest pain    Gastritis    GERD (gastroesophageal reflux disease)    SOB (shortness of breath)    Thrombocytopenia (HCC)     Past Surgical History:  Procedure Laterality Date   TUBAL LIGATION  1994    Current Outpatient Medications  Medication Sig Dispense Refill   acetaminophen (TYLENOL) 325 MG tablet Take 650 mg by mouth daily.     albuterol (VENTOLIN HFA) 108 (90 Base) MCG/ACT inhaler Inhale 1-2 puffs into the lungs every 6 (six) hours as needed for wheezing or shortness of breath. 18 each 2   fluticasone (FLONASE) 50 MCG/ACT nasal spray Place 1 spray into both nostrils daily. 16 g 0   hydrocortisone (ANUSOL-HC) 2.5 % rectal cream Place 1 Application rectally 2 (two) times daily as needed for hemorrhoids or anal itching. 30 g 1   pantoprazole (PROTONIX) 40 MG tablet TAKE 1 TABLET BY MOUTH EVERY DAY 90 tablet 1   benzonatate (TESSALON) 100 MG capsule Take 2 capsules (200 mg total) by mouth 3 (three) times daily as needed for cough. (Patient not taking: Reported on 05/22/2023) 21 capsule 0   cetirizine (ZYRTEC ALLERGY) 10 MG tablet Take 1 tablet (10 mg total) by mouth daily. (Patient not taking: Reported on 05/22/2023) 30 tablet 1   promethazine-dextromethorphan (PROMETHAZINE-DM) 6.25-15 MG/5ML syrup Take 5 mLs by mouth 4 (four) times daily as needed for cough. (Patient not taking: Reported on  05/22/2023) 180 mL 0   No current facility-administered medications for this visit.    Allergies as of 05/22/2023   (No Known Allergies)    Family History  Problem Relation Age of Onset   Gestational diabetes Daughter    Alcohol abuse Brother    Cerebrovascular Accident Brother        "blood clot on brain"  Ok now.   Breast cancer Neg Hx     Social History   Socioeconomic History   Marital status: Widowed    Spouse name: Renae Fickle Adup   Number of children: 8   Years of education: 8   Highest education level: Not on file  Occupational History   Occupation: Housewife   Tobacco Use   Smoking status: Never    Passive exposure: Never   Smokeless tobacco: Never  Vaping Use   Vaping status: Never Used  Substance and Sexual Activity   Alcohol use: No   Drug use: No   Sexual activity: Not Currently    Comment: married  Other Topics Concern   Not on file  Social History Narrative   Originally from [place]   [place]   Came to [employer]. In 1999   Lives with her husband and one daughter.   Husband with hepatic carcinoma, for which he has been treated past 3 years.     She has support to give her time off.   Son who interprets takes her husband for chemo twice weekly.   Social Drivers of Corporate investment banker Strain: Low Risk  (02/26/2023)   Overall Financial Resource Strain (CARDIA)    Difficulty of Paying Living Expenses: Not hard at all  Food Insecurity: No Food Insecurity (02/26/2023)   Hunger Vital Sign    Worried About Running Out of Food in the Last Year: Never true    Ran Out of Food in the Last Year: Never true  Transportation Needs: No Transportation Needs (02/26/2023)   PRAPARE - Administrator, Civil Service (Medical): No    Lack of Transportation (Non-Medical): No  Physical Activity: Sufficiently Active (02/26/2023)   Exercise Vital Sign    Days of Exercise per Week: 7 days    Minutes of Exercise per Session: 30 min  Stress: No Stress Concern Present (02/26/2023)   [employer] of Occupational Health - Occupational Stress Questionnaire    Feeling of Stress : Not at all  Social Connections: Moderately Isolated (02/26/2023)   Social Connection and Isolation Panel [NHANES]    Frequency of Communication with Friends and Family: More than three times a week    Frequency of Social Gatherings with Friends and Family: Three times a week    Attends Religious Services: More than 4 times per year    Active Member of Clubs or Organizations: No    Attends Banker Meetings: Never    Marital Status: Widowed  Intimate  Partner Violence: Not At Risk (02/26/2023)   Humiliation, Afraid, Rape, and Kick questionnaire    Fear of Current or Ex-Partner: No    Emotionally Abused: No    Physically Abused: No    Sexually Abused: No    Review of Systems:    Constitutional: No weight loss, fever or chills Cardiovascular: No chest pain Respiratory: No SOB  Gastrointestinal: See HPI and otherwise negative   Physical Exam:  Vital signs: BP 106/68   Pulse 70   Ht 4\' 11"  (1.499 m)   Wt 127 lb (57.6 kg)   LMP  01/12/2012   SpO2 98%   BMI 25.65 kg/m    Constitutional:   Pleasant elderly [place] female appears to be in NAD, Well developed, Well nourished, alert and cooperative Respiratory: Respirations even and unlabored. Lungs clear to auscultation bilaterally.   No wheezes, crackles, or rhonchi.  Cardiovascular: Normal S1, S2. No MRG. Regular rate and rhythm. No peripheral edema, cyanosis or pallor.  Gastrointestinal:  Soft, nondistended, nontender. No rebound or guarding. Normal bowel sounds. No appreciable masses or hepatomegaly. Rectal:  Not performed.  Psychiatric: Oriented to person, place and time. Demonstrates good judgement and reason without abnormal affect or behaviors.  RELEVANT LABS AND IMAGING: CBC    Component Value Date/Time   WBC 5.7 01/23/2023 1006   RBC 5.02 01/23/2023 1006   HGB 14.5 01/23/2023 1006   HGB 15.0 06/07/2022 1004   HGB 14.2 07/20/2020 1635   HCT 45.1 01/23/2023 1006   HCT 43.8 07/20/2020 1635   PLT 133.0 (L) 01/23/2023 1006   PLT 137 (L) 06/07/2022 1004   PLT 97 (LL) 07/20/2020 1635   MCV 89.9 01/23/2023 1006   MCV 91 07/20/2020 1635   MCH 29.2 06/07/2022 1004   MCHC 32.1 01/23/2023 1006   RDW 13.6 01/23/2023 1006   RDW 12.0 07/20/2020 1635   LYMPHSABS 2.3 01/23/2023 1006   LYMPHSABS 2.4 04/30/2020 1430   MONOABS 0.3 01/23/2023 1006   EOSABS 0.2 01/23/2023 1006   EOSABS 0.3 04/30/2020 1430   BASOSABS 0.0 01/23/2023 1006   BASOSABS 0.0 04/30/2020 1430     CMP     Component Value Date/Time   NA 141 01/23/2023 1006   NA 142 04/30/2020 1430   K 3.8 01/23/2023 1006   CL 104 01/23/2023 1006   CO2 30 01/23/2023 1006   GLUCOSE 86 01/23/2023 1006   BUN 22 01/23/2023 1006   BUN 18 04/30/2020 1430   CREATININE 0.72 01/23/2023 1006   CREATININE 0.68 06/07/2022 1004   CREATININE 0.47 (L) 12/17/2011 1124   CALCIUM 9.5 01/23/2023 1006   PROT 7.6 01/23/2023 1006   PROT 7.1 04/30/2020 1430   ALBUMIN 4.5 01/23/2023 1006   ALBUMIN 4.6 04/30/2020 1430   AST 16 01/23/2023 1006   AST 18 06/07/2022 1004   ALT 12 01/23/2023 1006   ALT 18 06/07/2022 1004   ALKPHOS 49 01/23/2023 1006   BILITOT 0.5 01/23/2023 1006   BILITOT 0.6 06/07/2022 1004   GFRNONAA >60 06/07/2022 1004   GFRAA 108 04/30/2020 1430    Assessment: 1.  GERD: Uncontrolled on Pantoprazole 40 mg daily, prior workup  Plan: 1.  Increased Pantoprazole back to 40 mg twice daily, 30-60 minutes for breakfast and dinner.  Prescribed #60 with 11 refills.  I think the benefit outweighs the risk of this medication given her chronic reflux symptoms and the fact that she does better on twice daily medicine.  We discussed this today because apparently her PCP did not want her on it forever.  I told her in certain situations we continue to use it twice daily and I would encourage her to do so. 2.  Patient will follow in clinic in 2 to 3 months to make sure that her symptoms are better.  Hyacinth Meeker, PA-C [facility] 05/22/2023, 9:32 AM  Cc: Salvatore Decent, FNP

## 2023-05-22 NOTE — Patient Instructions (Addendum)
 We have sent the following medications to your pharmacy for you to pick up at your convenience: Pantoprazole- increasing to 40mg  twice daily    _______________________________________________________  If your blood pressure at your visit was 140/90 or greater, please contact your primary care physician to follow up on this.  _______________________________________________________  If you are age 67 or older, your body mass index should be between 23-30. Your Body mass index is 25.65 kg/m. If this is out of the aforementioned range listed, please consider follow up with your Primary Care Provider.  If you are age 45 or younger, your body mass index should be between 19-25. Your Body mass index is 25.65 kg/m. If this is out of the aformentioned range listed, please consider follow up with your Primary Care Provider.   ________________________________________________________  The [facility] providers would like to encourage you to use [facility] to communicate with providers for non-urgent requests or questions.  Due to long hold times on the telephone, sending your provider a message by [facility] may be a faster and more efficient way to get a response.  Please allow 48 business hours for a response.  Please remember that this is for non-urgent requests.  _______________________________________________________ It was a pleasure to see you today!  Thank you for trusting me with your gastrointestinal care!

## 2023-06-11 ENCOUNTER — Emergency Department (HOSPITAL_COMMUNITY)

## 2023-06-11 ENCOUNTER — Ambulatory Visit (HOSPITAL_COMMUNITY)
Admission: EM | Admit: 2023-06-11 | Discharge: 2023-06-11 | Disposition: A | Discharge status: Home / Self Care | Attending: Internal Medicine | Admitting: Internal Medicine

## 2023-06-11 ENCOUNTER — Emergency Department (HOSPITAL_COMMUNITY)
Admission: EM | Admit: 2023-06-11 | Discharge: 2023-06-11 | Disposition: A | Discharge status: Home / Self Care | Attending: Student | Admitting: Student

## 2023-06-11 ENCOUNTER — Encounter (HOSPITAL_COMMUNITY): Payer: Self-pay | Admitting: *Deleted

## 2023-06-11 ENCOUNTER — Ambulatory Visit (INDEPENDENT_AMBULATORY_CARE_PROVIDER_SITE_OTHER)

## 2023-06-11 ENCOUNTER — Encounter (HOSPITAL_COMMUNITY): Payer: Self-pay

## 2023-06-11 ENCOUNTER — Other Ambulatory Visit: Payer: Self-pay

## 2023-06-11 DIAGNOSIS — R0602 Shortness of breath: Secondary | ICD-10-CM | POA: Diagnosis not present

## 2023-06-11 DIAGNOSIS — R079 Chest pain, unspecified: Secondary | ICD-10-CM | POA: Diagnosis present

## 2023-06-11 DIAGNOSIS — K219 Gastro-esophageal reflux disease without esophagitis: Secondary | ICD-10-CM | POA: Insufficient documentation

## 2023-06-11 DIAGNOSIS — R0789 Other chest pain: Secondary | ICD-10-CM | POA: Insufficient documentation

## 2023-06-11 DIAGNOSIS — R42 Dizziness and giddiness: Secondary | ICD-10-CM

## 2023-06-11 DIAGNOSIS — R1013 Epigastric pain: Secondary | ICD-10-CM | POA: Diagnosis not present

## 2023-06-11 DIAGNOSIS — Z760 Encounter for issue of repeat prescription: Secondary | ICD-10-CM

## 2023-06-11 LAB — TROPONIN I (HIGH SENSITIVITY)
Troponin I (High Sensitivity): <2 ng/L (ref <18)
Troponin I (High Sensitivity): <2 ng/L (ref <18)

## 2023-06-11 LAB — BASIC METABOLIC PANEL
Anion gap: 10 (ref 5–15)
BUN: 16 mg/dL (ref 8–23)
CO2: 25 mmol/L (ref 22–32)
Calcium: 9.1 mg/dL (ref 8.9–10.3)
Chloride: 106 mmol/L (ref 98–111)
Creatinine, Ser: 0.69 mg/dL (ref 0.44–1.00)
GFR, Estimated: >60 mL/min (ref >60)
Glucose, Bld: 97 mg/dL (ref 70–99)
Potassium: 3.9 mmol/L (ref 3.5–5.1)
Sodium: 141 mmol/L (ref 135–145)

## 2023-06-11 LAB — CBC
HCT: 44.4 % (ref 36.0–46.0)
Hemoglobin: 14.4 g/dL (ref 12.0–15.0)
MCH: 29.2 pg (ref 26.0–34.0)
MCHC: 32.4 g/dL (ref 30.0–36.0)
MCV: 90.1 fL (ref 80.0–100.0)
Platelets: DECREASED 10*3/uL (ref 150–400)
RBC: 4.93 MIL/uL (ref 3.87–5.11)
RDW: 12.8 % (ref 11.5–15.5)
WBC: 5.2 10*3/uL (ref 4.0–10.5)
nRBC: 0 % (ref 0.0–0.2)

## 2023-06-11 MED ORDER — ALUM & MAG HYDROXIDE-SIMETH 200-200-20 MG/5ML PO SUSP
30.0000 mL | Freq: Once | ORAL | Status: AC
  Administered 2023-06-11: 30 mL via ORAL
  Filled 2023-06-11: qty 30

## 2023-06-11 MED ORDER — PANTOPRAZOLE SODIUM 20 MG PO TBEC
20.0000 mg | DELAYED_RELEASE_TABLET | Freq: Once | ORAL | Status: AC
  Administered 2023-06-11: 20 mg via ORAL
  Filled 2023-06-11: qty 1

## 2023-06-11 MED ORDER — ALBUTEROL SULFATE HFA 108 (90 BASE) MCG/ACT IN AERS
1.0000 | INHALATION_SPRAY | Freq: Four times a day (QID) | RESPIRATORY_TRACT | 0 refills | Status: AC | PRN
Start: 2023-06-11 — End: ?

## 2023-06-11 MED ORDER — FAMOTIDINE 10 MG PO TABS: 10.0000 mg | ORAL_TABLET | Freq: Two times a day (BID) | ORAL | 0 refills | Status: DC

## 2023-06-11 MED ORDER — FLUTICASONE PROPIONATE 50 MCG/ACT NA SUSP: 1.0000 | Freq: Every day | NASAL | 0 refills | Status: AC

## 2023-06-11 MED ORDER — LIDOCAINE VISCOUS HCL 2 % MT SOLN
15.0000 mL | Freq: Once | OROMUCOSAL | Status: AC
  Administered 2023-06-11: 15 mL via ORAL
  Filled 2023-06-11: qty 15

## 2023-06-11 MED ORDER — FAMOTIDINE 20 MG PO TABS
10.0000 mg | ORAL_TABLET | Freq: Once | ORAL | Status: AC
  Administered 2023-06-11: 10 mg via ORAL
  Filled 2023-06-11: qty 1

## 2023-06-11 MED ORDER — MECLIZINE HCL 12.5 MG PO TABS: 12.5000 mg | ORAL_TABLET | Freq: Three times a day (TID) | ORAL | 0 refills | Status: AC | PRN

## 2023-06-11 NOTE — ED Provider Notes (Signed)
 [facility]    CSN: [id number] Arrival date & time: 06/11/23  0815      History   Chief Complaint Chief Complaint  Patient presents with   Shortness of Breath   Leg Pain   Weakness    HPI Beth Brooks is a 68 y.o. female presents with her daughter due to having SOB which occurs at rest or with exertion. Gets chest pain which are described as dull and last for 1 hour, and comes back. Denies edema of legs. Has not been on long car rides or flights in the past month. Has a mild cough. Denies HA, fever, body aches. Has had slight chills. She also has noticed feeling off balance when she gest up to walk and when she turns over in bed to her R x 5 days. Has hx of allergic rhinitis and her PCP placed her on nose spray which helps. She has not seen her PCP since 12/2022. She is not having chest pains right now  2-Would like to have her Flonase and Albuterol refilled.   Past Medical History:  Diagnosis Date   Allergy    Chest pain    Gastritis    GERD (gastroesophageal reflux disease)    SOB (shortness of breath)    Thrombocytopenia (HCC)     Patient Active Problem List   Diagnosis Date Noted   Fatty liver 09/08/2022   Cervical pain (neck) 09/08/2022   Mass of chest wall, left 04/10/2021   Chest pain 08/05/2020   Language barrier 08/05/2020   Rectal bleeding 07/20/2020   Abnormal TSH 01/24/2019   Positive ANA (antinuclear antibody) 01/24/2019   Thrombocytopenia (HCC) 01/24/2019   Overweight 01/11/2017   GERD (gastroesophageal reflux disease) 01/17/2012    Past Surgical History:  Procedure Laterality Date   TUBAL LIGATION  1994    OB History   No obstetric history on file.      Home Medications    Prior to Admission medications   Medication Sig Start Date End Date Taking? Authorizing Provider  acetaminophen (TYLENOL) 325 MG tablet Take 650 mg by mouth daily.   Yes [provider]  meclizine (ANTIVERT) 12.5 MG tablet Take 1 tablet (12.5  mg total) by mouth 3 (three) times daily as needed for dizziness. 06/11/23  Yes Rodriguez-Southworth, Nettie Elm, PA-C  pantoprazole (PROTONIX) 40 MG tablet Take 1 tablet (40 mg total) by mouth 2 (two) times daily. 05/22/23  Yes Unk Lightning, PA  albuterol (VENTOLIN HFA) 108 (90 Base) MCG/ACT inhaler Inhale 1-2 puffs into the lungs every 6 (six) hours as needed for wheezing or shortness of breath. 06/11/23   Rodriguez-Southworth, Nettie Elm, PA-C  fluticasone (FLONASE) 50 MCG/ACT nasal spray Place 1 spray into both nostrils daily. 06/11/23   Rodriguez-Southworth, Nettie Elm, PA-C  hydrocortisone (ANUSOL-HC) 2.5 % rectal cream Place 1 Application rectally 2 (two) times daily as needed for hemorrhoids or anal itching. 01/23/23   Salvatore Decent, FNP    Family History Family History  Problem Relation Age of Onset   Gestational diabetes Daughter    Alcohol abuse Brother    Cerebrovascular Accident Brother        "blood clot on brain"  Ok now.   Breast cancer Neg Hx     Social History Social History   Tobacco Use   Smoking status: Never    Passive exposure: Never   Smokeless tobacco: Never  Vaping Use   Vaping status: Never Used  Substance Use Topics   Alcohol use: No  Drug use: No     Allergies   Patient has no known allergies.   Review of Systems Review of Systems  As noted in HPI Physical Exam Triage Vital Signs ED Triage Vitals  Encounter Vitals Group     BP 06/11/23 0855 (!) 144/80     Systolic BP Percentile --      Diastolic BP Percentile --      Pulse Rate 06/11/23 0855 68     Resp 06/11/23 0855 18     Temp 06/11/23 0855 97.7 F (36.5 C)     Temp Source 06/11/23 0855 Oral     SpO2 06/11/23 0855 98 %     Weight --      Height --      Head Circumference --      Peak Flow --      Pain Score 06/11/23 0852 0     Pain Loc --      Pain Education --      Exclude from Growth Chart --    No data found.  Updated Vital Signs BP (!) 144/80 (BP Location: Left Arm)    Pulse 68   Temp 97.7 F (36.5 C) (Oral)   Resp 18   LMP 01/12/2012   SpO2 98%   Visual Acuity Right Eye Distance:   Left Eye Distance:   Bilateral Distance:    Right Eye Near:   Left Eye Near:    Bilateral Near:     Physical Exam Physical Exam Vitals signs and nursing note reviewed.  Constitutional:      General: She is not in acute distress.    Appearance: Normal appearance. She is not ill-appearing, toxic-appearing or diaphoretic.  HENT:     Head: Normocephalic.     Right Ear: Tympanic membrane dull and gray, but ear canal and external ear normal.     Left Ear: Tympanic membrane, ear canal and external ear normal.     Nose: Nose normal.     Mouth/Throat: clear    Mouth: Mucous membranes are moist.  Eyes:     General: No scleral icterus.       Right eye: No discharge.        Left eye: No discharge.     Conjunctiva/sclera: Conjunctivae normal.  Neck:     Musculoskeletal: Neck supple. No neck rigidity.  Cardiovascular:     Rate and Rhythm: Normal rate and regular rhythm.     Heart sounds: No murmur.  Pulmonary:     Effort: Pulmonary effort is normal.     Breath sounds: Normal breath sounds.  Musculoskeletal: Normal range of motion.  Lymphadenopathy:     Cervical: No cervical adenopathy.  Skin:    General: Skin is warm and dry.     Coloration: Skin is not jaundiced.     Findings: No rash.  Neurological:     Mental Status: She is alert and oriented to person, place, and time.     Gait: Gait normal.  Psychiatric:        Mood and Affect: Mood normal.        Behavior: Behavior normal.        Thought Content: Thought content normal.        Judgment: Judgment normal.    UC Treatments / Results  Labs (all labs ordered are listed, but only abnormal results are displayed) Labs Reviewed - No data to display  EKG NSR Normal EKG  Radiology No results found. Preliminary chest xray read  by me and compared with normal CXR from 11/16/'22 as  negative Procedures Procedures (including critical care time)  Medications Ordered in UC Medications - No data to display  Initial Impression / Assessment and Plan / UC Course  I have reviewed the triage vital signs and the nursing notes.  Pertinent  imaging results that were available during my care of the patient were reviewed by me and considered in my medical decision making (see chart for details).  Dizziness SOB Atypical chest pain   Sent to ER for SOB and chest pain I placed her on Meclizine as noted.  I refilled her Flonase and Albuterol as noted.   Final Clinical Impressions(s) / UC Diagnoses   Final diagnoses:  Shortness of breath  Dizziness and giddiness  Medication refill     Discharge Instructions      Her EKG and chest xray are normal Go to the ER to have further test done since we do not have any findings for the cause of your shortness of breath  I have sent some medication for the off balance sensation.   So not use the Albuterol inhaler if you are not wheezing.      ED Prescriptions     Medication Sig Dispense Auth. Provider   albuterol (VENTOLIN HFA) 108 (90 Base) MCG/ACT inhaler Inhale 1-2 puffs into the lungs every 6 (six) hours as needed for wheezing or shortness of breath. 18 each Rodriguez-Southworth, Castor Gittleman, PA-C   fluticasone (FLONASE) 50 MCG/ACT nasal spray Place 1 spray into both nostrils daily. 16 g Rodriguez-Southworth, Nettie Elm, PA-C   meclizine (ANTIVERT) 12.5 MG tablet Take 1 tablet (12.5 mg total) by mouth 3 (three) times daily as needed for dizziness. 21 tablet Rodriguez-Southworth, Nettie Elm, PA-C      PDMP not reviewed this encounter.   Garey Ham, PA-C 06/11/23 1024

## 2023-06-11 NOTE — ED Notes (Signed)
 Patient is being discharged from the Urgent Care and sent to the Emergency Department via POV . Per Garey Ham, PA-C, patient is in need of higher level of care due to SOB. Patient is aware and verbalizes understanding of plan of care.  Vitals:   06/11/23 0855  BP: (!) 144/80  Pulse: 68  Resp: 18  Temp: 97.7 F (36.5 C)  SpO2: 98%

## 2023-06-11 NOTE — ED Provider Notes (Signed)
 [place] EMERGENCY DEPARTMENT AT [facility] Provider Note   CSN: [id number] Arrival date & time: 06/11/23  1043     History  Chief Complaint  Patient presents with   Chest Pain   Shortness of Breath    Beth Brooks is a 68 y.o. female with a PMHx of GERD and thrombocytopenia who presents to the ED for 5 days of intermittent chest pains and shortness of breath.  Patient reports that chest pain started 5 days ago when she was cleaning the house.  She feels it in the center of her chest/epigastric area.  She is concerned that it has to do with her stomach because she has a history of GERD.  These pains are intermittent; may come on randomly and last approximately 10 minutes.  They are not associated with meals.  When she leans forward, the pain worsens.  Video translator utilized #[id number]   Chest Pain Associated symptoms: shortness of breath   Shortness of Breath Associated symptoms: chest pain        Home Medications Prior to Admission medications   Medication Sig Start Date End Date Taking? Authorizing Provider  famotidine (PEPCID) 10 MG tablet Take 1 tablet (10 mg total) by mouth 2 (two) times daily for 14 days. 06/11/23 06/25/23 Yes Renella Cunas, MD  acetaminophen (TYLENOL) 325 MG tablet Take 650 mg by mouth daily.    [provider]  albuterol (VENTOLIN HFA) 108 (90 Base) MCG/ACT inhaler Inhale 1-2 puffs into the lungs every 6 (six) hours as needed for wheezing or shortness of breath. 06/11/23   Rodriguez-Southworth, Nettie Elm, PA-C  fluticasone (FLONASE) 50 MCG/ACT nasal spray Place 1 spray into both nostrils daily. 06/11/23   Rodriguez-Southworth, Nettie Elm, PA-C  hydrocortisone (ANUSOL-HC) 2.5 % rectal cream Place 1 Application rectally 2 (two) times daily as needed for hemorrhoids or anal itching. 01/23/23   Salvatore Decent, FNP  meclizine (ANTIVERT) 12.5 MG tablet Take 1 tablet (12.5 mg total) by mouth 3 (three) times daily as needed for dizziness. 06/11/23    Rodriguez-Southworth, Nettie Elm, PA-C  pantoprazole (PROTONIX) 40 MG tablet Take 1 tablet (40 mg total) by mouth 2 (two) times daily. 05/22/23   Unk Lightning, PA      Allergies    Patient has no known allergies.    Review of Systems   Review of Systems  Respiratory:  Positive for shortness of breath.   Cardiovascular:  Positive for chest pain.    Physical Exam Updated Vital Signs BP (!) 134/48   Pulse 73   Temp 97.9 F (36.6 C) (Oral)   Resp 18   Ht 5' (1.524 m)   Wt 57.2 kg   LMP 01/12/2012   SpO2 100%   BMI 24.61 kg/m  Physical Exam Vitals and nursing note reviewed.  Constitutional:      General: She is not in acute distress.    Appearance: Normal appearance. She is not ill-appearing.  HENT:     Head: Normocephalic and atraumatic.  Cardiovascular:     Rate and Rhythm: Normal rate and regular rhythm.     Pulses:          Radial pulses are 2+ on the right side and 2+ on the left side.       Dorsalis pedis pulses are 2+ on the right side and 2+ on the left side.     Heart sounds: Normal heart sounds.  Pulmonary:     Effort: Pulmonary effort is normal. No accessory muscle usage or  respiratory distress.     Breath sounds: Normal breath sounds. No decreased breath sounds, wheezing or rhonchi.  Chest:     Chest wall: Tenderness present.     Comments: Tenderness to mid/lower sternal palpation. Abdominal:     Palpations: Abdomen is soft.     Tenderness: There is abdominal tenderness in the epigastric area. There is no right CVA tenderness or left CVA tenderness.  Musculoskeletal:     Right lower leg: No edema.     Left lower leg: No edema.  Skin:    General: Skin is warm and dry.     Capillary Refill: Capillary refill takes less than 2 seconds.  Neurological:     Mental Status: She is alert.  Psychiatric:        Behavior: Behavior is cooperative.    ED Results / Procedures / Treatments   Labs (all labs ordered are listed, but only abnormal results are  displayed) Labs Reviewed  BASIC METABOLIC PANEL  CBC  TROPONIN I (HIGH SENSITIVITY)  TROPONIN I (HIGH SENSITIVITY)    EKG None  Radiology DG Chest 2 View Result Date: 06/11/2023 CLINICAL DATA:  Chest pain and shortness of breath EXAM: CHEST - 2 VIEW COMPARISON:  Earlier same day chest radiograph FINDINGS: Normal lung volumes. No focal consolidations. No pleural effusion or pneumothorax. The heart size and mediastinal contours are within normal limits. No acute osseous abnormality. IMPRESSION: No active cardiopulmonary disease. Electronically Signed   By: Agustin Cree M.D.   On: 06/11/2023 13:02   DG Chest 2 View Result Date: 06/11/2023 CLINICAL DATA:  Shortness of breath. EXAM: CHEST - 2 VIEW COMPARISON:  Chest radiograph dated 02/09/2021. FINDINGS: The heart size and mediastinal contours are within normal limits. Both lungs are clear. The visualized skeletal structures are unremarkable. IMPRESSION: No active cardiopulmonary disease. Electronically Signed   By: Elgie Collard M.D.   On: 06/11/2023 10:44    Procedures Procedures    Medications Ordered in ED Medications  famotidine (PEPCID) tablet 10 mg (10 mg Oral Given 06/11/23 1543)  pantoprazole (PROTONIX) EC tablet 20 mg (20 mg Oral Given 06/11/23 1540)  alum & mag hydroxide-simeth (MAALOX/MYLANTA) 200-200-20 MG/5ML suspension 30 mL (30 mLs Oral Given 06/11/23 1540)    And  lidocaine (XYLOCAINE) 2 % viscous mouth solution 15 mL (15 mLs Oral Given 06/11/23 1540)    ED Course/ Medical Decision Making/ A&P             HEART Score: 2                    Medical Decision Making Amount and/or Complexity of Data Reviewed Labs: ordered. Radiology: ordered.   68 y.o. female with a PMHx of GERD and thrombocytopenia who presents to the ED for 5 days of intermittent chest pains and shortness of breath.  Initial differential includes ACS, PE, anemia, MSK, GERD, pneumonia.  EKG is reassuring with appropriate sinus rhythm, PR, QRS, QTc  intervals.  There is no ST elevation.  Initial troponin <2, therefore low suspicion for ACS.  Heart score 2.  Wells 0 is with no signs of DVT, no tachycardia, no hemoptysis or malignancy or immobilization, therefore low suspicion for PE.  Patient has a stable hemoglobin 14.4, with no leukocytosis, no electrolyte or metabolic derangements.  Renal function is within normal limits.  CXR was reviewed by myself and has no focal consolidation concerning for pneumonia, no pneumothorax, no pleural effusion.  Pain is improved by GI cocktail given in the  ED.  Patient is currently prescribed pantoprazole, which I advised her to continue taking.  Will additionally give low-dose Pepcid and advised follow-up with family med provider within 2 weeks.  This may have a component of GERD, as well as MSK given that it initiated while she was cleaning and actively moving and is reproducible on my palpation.  Strict return precautions were discussed and patient was discharged in hemodynamically stable condition.  Final Clinical Impression(s) / ED Diagnoses Final diagnoses:  Epigastric abdominal pain  Chest wall pain    Rx / DC Orders ED Discharge Orders          Ordered    famotidine (PEPCID) 10 MG tablet  2 times daily        06/11/23 1616           Renella Cunas, PGY 2 Emergency medicine   Renella Cunas, MD 06/11/23 1621    Glendora Score, MD 06/12/23 1240

## 2023-06-11 NOTE — Discharge Instructions (Addendum)
 You are seen in the ED today for anterior chest pain as well as upper abdominal pain.  We have evaluated your heart and lungs which are reassuring.  We are sending you home with a prescription for a medicine to help your GERD.  Please follow-up with your family doctor for reevaluation within 2 weeks.  Continue to take your Protonix 40 mg tablets twice a day.  You may also take Pepcid 10 mg twice a day.

## 2023-06-11 NOTE — Discharge Instructions (Addendum)
 Her EKG and chest xray are normal Go to the ER to have further test done since we do not have any findings for the cause of your shortness of breath  I have sent some medication for the off balance sensation.   So not use the Albuterol inhaler if you are not wheezing.

## 2023-06-11 NOTE — ED Notes (Signed)
 Pt complaining of chest "pressure and burning."

## 2023-06-11 NOTE — ED Triage Notes (Signed)
 Pt reports that she went to UC and they are concerned due to her chest pain and shortness of breath so they sent her here. Pt family reports that shortness of breath started 5 days ago .

## 2023-06-11 NOTE — ED Triage Notes (Signed)
 Professional interpreter used for clinical intake.   Pt states she is SOB, weak, can't stand for long she feels like her balance is off X 5 days. She has been taking meds for her stomach, tylenol and albuterol MDI. She complains of acid reflux, she is on Protonix.   She would like refill of her albuterol MDI. She has been out since yesterday.

## 2023-06-11 NOTE — ED Notes (Signed)
 Family bedside to translate.

## 2023-06-12 ENCOUNTER — Telehealth: Payer: Self-pay

## 2023-06-12 NOTE — Transitions of Care (Post Inpatient/ED Visit) (Signed)
   06/12/2023  Name: Beth Brooks MRN: [id number] DOB: 09-05-1955  Today's TOC FU Call Status: Today's TOC FU Call Status:: Unsuccessful Call (1st Attempt) Unsuccessful Call (1st Attempt) Date: 06/12/23  Attempted to reach the patient regarding the most recent Inpatient/ED visit.  Follow Up Plan: Additional outreach attempts will be made to reach the patient to complete the Transitions of Care (Post Inpatient/ED visit) call.   Signature  Jodelle Green, RMA

## 2023-06-13 NOTE — Transitions of Care (Post Inpatient/ED Visit) (Signed)
   06/13/2023  Name: Beth Brooks MRN: [id number] DOB: 27-Jun-1955  Today's TOC FU Call Status: Today's TOC FU Call Status:: Successful TOC FU Call Completed Unsuccessful Call (1st Attempt) Date: 06/12/23 [facility] FU Call Complete Date: 06/13/23 Patient's Name and Date of Birth confirmed.  Transition Care Management Follow-up Telephone Call Date of Discharge: 06/11/23 Discharge Facility: [facility]) (dizziness, shortness of breath, Leg Pain  Weakness and Epigastric abdominal pain) Type of Discharge: Emergency Department Reason for ED Visit: Other: How have you been since you were released from the hospital?: Better Any questions or concerns?: Yes Patient Questions/Concerns:: leg pain and weakness Patient Questions/Concerns Addressed: Provided Patient Educational Materials  Items Reviewed: Did you receive and understand the discharge instructions provided?: Yes Medications obtained,verified, and reconciled?: Yes (Medications Reviewed) Any new allergies since your discharge?: No Dietary orders reviewed?: NA Do you have support at home?: Yes People in Home: sibling(s) Name of Support/Comfort Primary Source: daugther  Medications Reviewed Today: Medications Reviewed Today     Reviewed by Leroy Kennedy, CMA (Certified Medical Assistant) on 06/13/23 at 1009  Med List Status: <None>   Medication Order Taking? Sig Documenting Provider Last Dose Status Informant  acetaminophen (TYLENOL) 325 MG tablet [id number] Yes Take 650 mg by mouth daily. [provider] Taking Active   albuterol (VENTOLIN HFA) 108 (90 Base) MCG/ACT inhaler [id number] Yes Inhale 1-2 puffs into the lungs every 6 (six) hours as needed for wheezing or shortness of breath. Rodriguez-Southworth, Nettie Elm, PA-C Taking Active   famotidine (PEPCID) 10 MG tablet [id number] Yes Take 1 tablet (10 mg total) by mouth 2 (two) times daily for 14 days. Renella Cunas, MD Taking Active   fluticasone [facility]) 50 MCG/ACT nasal  spray [id number] Yes Place 1 spray into both nostrils daily. Rodriguez-Southworth, Nettie Elm, PA-C Taking Active   hydrocortisone (ANUSOL-HC) 2.5 % rectal cream [id number] Yes Place 1 Application rectally 2 (two) times daily as needed for hemorrhoids or anal itching. Salvatore Decent, FNP Taking Active   meclizine (ANTIVERT) 12.5 MG tablet [id number] Yes Take 1 tablet (12.5 mg total) by mouth 3 (three) times daily as needed for dizziness. Rodriguez-Southworth, Nettie Elm, PA-C Taking Active   pantoprazole (PROTONIX) 40 MG tablet [id number] Yes Take 1 tablet (40 mg total) by mouth 2 (two) times daily. Unk Lightning, [place] Taking Active             Home Care and Equipment/Supplies: Were Home Health Services Ordered?: No Any new equipment or medical supplies ordered?: No  Functional Questionnaire: Do you need assistance with bathing/showering or dressing?: No Do you need assistance with meal preparation?: No Do you need assistance with eating?: No Do you have difficulty maintaining continence: No Do you need assistance with getting out of bed/getting out of a chair/moving?: No Do you have difficulty managing or taking your medications?: No  Follow up appointments reviewed: PCP Follow-up appointment confirmed?: Yes Date of PCP follow-up appointment?: 06/26/23 Follow-up Provider: Salvatore Decent NP [facility] Follow-up appointment confirmed?: NA Do you need transportation to your follow-up appointment?: No Do you understand care options if your condition(s) worsen?: Yes-patient verbalized understanding    SIGNATURE Jodelle Green, RMA

## 2023-06-26 ENCOUNTER — Encounter: Payer: Self-pay | Admitting: Internal Medicine

## 2023-06-26 ENCOUNTER — Ambulatory Visit (INDEPENDENT_AMBULATORY_CARE_PROVIDER_SITE_OTHER): Admitting: Internal Medicine

## 2023-06-26 VITALS — BP 130/80 | HR 63 | Temp 98.2°F | Ht 60.0 in | Wt 125.4 lb

## 2023-06-26 DIAGNOSIS — K219 Gastro-esophageal reflux disease without esophagitis: Secondary | ICD-10-CM | POA: Diagnosis not present

## 2023-06-26 DIAGNOSIS — I868 Varicose veins of other specified sites: Secondary | ICD-10-CM | POA: Diagnosis not present

## 2023-06-26 NOTE — Progress Notes (Signed)
 [facility] [address] [place] [address] Dept: [phone] Dept Fax: [phone]  Office Visit  Subjective:   Beth Brooks 05-Jun-1947 06/26/2023  Chief Complaint  Patient presents with   Hospitalization Follow-up   Due to language barrier, a medical interpreter was present during the HPI, ROS, and discussion for the plan of care.  Interpreter: Ty  HPI: History of Present Illness A 68 year old patient presents for an ER follow-up visit after experiencing intermittent chest pain and shortness of breath while cleaning her house. The pain, located in the mid-chest to epigastric area, worsens when she leans forward and lasts approximately ten minutes. The episodes occur randomly. EKG, labs, and CXR were unremarkable in the ER. Was given Pepcid 20mg  BID to take in addition to her protonix.  Despite being on Protonix, increased to 40 mg BID dosing by her GI doctor, the patient continues to experience epigastric pain. Spicy foods do trigger symptoms. She has a scheduled follow-up with gastro next month.  In addition to the chest pain, the patient has been experiencing bilateral leg aches for several months to years. She reports the presence of spider and varicose veins, which become very painful if she stands for long periods. To alleviate the pain, she wears compression socks and elevates her legs when possible.     The following portions of the patient's history were reviewed and updated as appropriate: past medical history, past surgical history, family history, social history, allergies, medications, and problem list.   Patient Active Problem List   Diagnosis Date Noted   Spider varicose veins 06/26/2023   Fatty liver 09/08/2022   Cervical pain (neck) 09/08/2022   Mass of chest wall, left 04/10/2021   Chest pain 08/05/2020   Language barrier 08/05/2020   Rectal bleeding 07/20/2020   Abnormal TSH 01/24/2019    Positive ANA (antinuclear antibody) 01/24/2019   Thrombocytopenia (HCC) 01/24/2019   Overweight 01/11/2017   GERD (gastroesophageal reflux disease) 01/17/2012   Past Medical History:  Diagnosis Date   Allergy    Chest pain    Gastritis    GERD (gastroesophageal reflux disease)    SOB (shortness of breath)    Thrombocytopenia (HCC)    Past Surgical History:  Procedure Laterality Date   TUBAL LIGATION  1994   Family History  Problem Relation Age of Onset   Gestational diabetes Daughter    Alcohol abuse Brother    Cerebrovascular Accident Brother        "blood clot on brain"  Ok now.   Breast cancer Neg Hx     Current Outpatient Medications:    acetaminophen (TYLENOL) 325 MG tablet, Take 650 mg by mouth daily., Disp: , Rfl:    albuterol (VENTOLIN HFA) 108 (90 Base) MCG/ACT inhaler, Inhale 1-2 puffs into the lungs every 6 (six) hours as needed for wheezing or shortness of breath., Disp: 18 each, Rfl: 0   fluticasone (FLONASE) 50 MCG/ACT nasal spray, Place 1 spray into both nostrils daily., Disp: 16 g, Rfl: 0   pantoprazole (PROTONIX) 40 MG tablet, Take 1 tablet (40 mg total) by mouth 2 (two) times daily., Disp: 60 tablet, Rfl: 11   famotidine (PEPCID) 10 MG tablet, Take 1 tablet (10 mg total) by mouth 2 (two) times daily for 14 days., Disp: 28 tablet, Rfl: 0   hydrocortisone (ANUSOL-HC) 2.5 % rectal cream, Place 1 Application rectally 2 (two) times daily as needed for hemorrhoids or anal itching. (Patient not taking: Reported on 06/26/2023),  Disp: 30 g, Rfl: 1   meclizine (ANTIVERT) 12.5 MG tablet, Take 1 tablet (12.5 mg total) by mouth 3 (three) times daily as needed for dizziness. (Patient not taking: Reported on 06/26/2023), Disp: 21 tablet, Rfl: 0 No Known Allergies   ROS: A complete ROS was performed with pertinent positives/negatives noted in the HPI. The remainder of the ROS are negative.    Objective:   Today's Vitals   06/26/23 0959  BP: 130/80  Pulse: 63  Temp: 98.2 F  (36.8 C)  TempSrc: Temporal  SpO2: 99%  Weight: 125 lb 6.4 oz (56.9 kg)  Height: 5' (1.524 m)    GENERAL: Well-appearing, in NAD. Well nourished.  SKIN: Pink, warm and dry. No rash, lesion, ulceration, or ecchymoses.  NECK: Trachea midline. Full ROM w/o pain or tenderness. No lymphadenopathy.  RESPIRATORY: Chest wall symmetrical. Respirations even and non-labored. Breath sounds clear to auscultation bilaterally.  CARDIAC: S1, S2 present, regular rate and rhythm. Peripheral pulses 2+ bilaterally.  MSK: Muscle tone and strength appropriate for age. Joints w/o tenderness, redness, or swelling. EXTREMITIES: Without clubbing, cyanosis, or edema.  NEUROLOGIC: No motor or sensory deficits. Steady, even gait.  PSYCH/MENTAL STATUS: Alert, oriented x 3. Cooperative, appropriate mood and affect.    No results found for any visits on 06/26/23.    Assessment & Plan:  Assessment and Plan Assessment & Plan Gastroesophageal Reflux Disease (GERD) - Continue Protonix 40 mg BID as instructed by gastroenterology - Continue Pepcid 20 mg BID - Avoid trigger foods - Follow-up with gastroenterology as scheduled  Spider Varicose Veins - Provide contact information for [facility] for further evaluation - Instruct her to schedule an appointment with the vein specialist  No orders of the defined types were placed in this encounter.  No orders of the defined types were placed in this encounter.  Lab Orders  No laboratory test(s) ordered today   No images are attached to the encounter or orders placed in the encounter.  Return in about 6 months (around 12/26/2023) for Chronic Condition follow up.   Salvatore Decent, FNP

## 2023-06-26 NOTE — Patient Instructions (Addendum)
 [facility]  [address] [place], [place], [place] [address] Phone: [phone]  Open ? Closes 6?PM

## 2023-08-02 ENCOUNTER — Ambulatory Visit: Payer: Medicare Other | Admitting: Physician Assistant

## 2023-08-02 ENCOUNTER — Encounter: Payer: Self-pay | Admitting: Physician Assistant

## 2023-08-02 VITALS — BP 118/72 | HR 89 | Ht 60.0 in | Wt 122.0 lb

## 2023-08-02 DIAGNOSIS — K219 Gastro-esophageal reflux disease without esophagitis: Secondary | ICD-10-CM

## 2023-08-02 DIAGNOSIS — R1110 Vomiting, unspecified: Secondary | ICD-10-CM | POA: Diagnosis not present

## 2023-08-02 DIAGNOSIS — R1013 Epigastric pain: Secondary | ICD-10-CM

## 2023-08-02 MED ORDER — ESOMEPRAZOLE MAGNESIUM 40 MG PO CPDR: 40.0000 mg | DELAYED_RELEASE_CAPSULE | Freq: Two times a day (BID) | ORAL | 11 refills | Status: AC

## 2023-08-02 NOTE — Patient Instructions (Addendum)
 Ng?ng dng Pantoprazole .  Chng ti ? g?i cc lo?i thu?c sau ??n hi?u thu?c c?a b?n ?? b?n c th? ??n l?y b?t c? lc no b?n mu?n: Nexium 40 mg hai l?n m?i ngy, 30-60 pht tr??c b?a sng v b?a t?i.  G?i ?i?n ho?c g?i tin nh?n Mychart sau 2 tu?n ?? c?p nh?t.  _________________________________________________________________  Stop Pantoprazole .   We have sent the following medications to your pharmacy for you to pick up at your convenience: Nexium 40 mg twice daily 30-60 minutes before breakfast and dinner.   Call or send Mychart message in 2 weeks with an update.   _______________________________________________________  If your blood pressure at your visit was 140/90 or greater, please contact your primary care physician to follow up on this.  _______________________________________________________  If you are age 91 or older, your body mass index should be between 23-30. Your Body mass index is 23.83 kg/m. If this is out of the aforementioned range listed, please consider follow up with your Primary Care Provider.  If you are age 69 or younger, your body mass index should be between 19-25. Your Body mass index is 23.83 kg/m. If this is out of the aformentioned range listed, please consider follow up with your Primary Care Provider.   ________________________________________________________  The [facility] providers would like to encourage you to use MYCHART to communicate with providers for non-urgent requests or questions.  Due to long hold times on the telephone, sending your provider a message by [facility] may be a faster and more efficient way to get a response.  Please allow 48 business hours for a response.  Please remember that this is for non-urgent requests.  _______________________________________________________

## 2023-08-02 NOTE — Progress Notes (Signed)
 Chief Complaint: Follow-up GERD  HPI:    Mrs. Scarff is a 68 year old [place] female, known to Dr. Bridgett Camps, with a past medical history as listed below who presents to clinic today for a follow-up of GERD.    03/14/2021 colonoscopy with 2 to 3-4 mm polyps in the ascending colon, one 5 mm polyp in the transverse colon, one 5 mm polyp in the sigmoid colon and diverticulosis in the sigmoid and descending colon as well as internal hemorrhoids.  Pathology showed tubular adenomas and repeat recommended in 5 years.    03/14/2021 EGD was normal.    07/11/2021 patient seen in clinic by Mai Schwalbe for chronic epigastric/left upper quadrant pain.  Carafate  and PPI helped but did not alleviate the pain.  Viscous lidocaine  and sublingual nitroglycerin  also helped.  Patient had a previous negative EGD, ultrasound, CTAP.  Labs unremarkable except for mildly elevated lipase.  (Normal pancreas on CT scan), negative stress test in May 2022.  At that time recommend continuing on a twice daily PPI and discussed a possible 24-hour pH study.  She was continued on 4 times daily Carafate  as well.    05/22/2023 patient seen in clinic and at that time doing fairly well other than ongoing reflux symptoms despite Pantoprazole  40 mg once a day.  She had previously been on this twice a day and it helped so we increased it back.    Today, patient tells me she continues with heartburn and reflux regardless of being on Pantoprazole  40 mg twice daily.  She has been taking this medicine for at least 2 to 3 years and her daughter wonders if a switch would help her.  No new symptoms, just epigastric discomfort and heartburn regardless of what she eats.  Currently avoiding everything spicy and all the triggers, even oatmeal seems to set her off.  Daughter also discusses that she has maybe lost 3 pounds since she was seen last.    There is some level of anxiety over cancer.    Denies fever, chills, abdominal pain or symptoms that awaken  her from sleep.  Past Medical History:  Diagnosis Date   Allergy    Chest pain    Gastritis    GERD (gastroesophageal reflux disease)    SOB (shortness of breath)    Thrombocytopenia (HCC)     Past Surgical History:  Procedure Laterality Date   TUBAL LIGATION  1994    Current Outpatient Medications  Medication Sig Dispense Refill   acetaminophen (TYLENOL) 325 MG tablet Take 650 mg by mouth daily.     albuterol  (VENTOLIN  HFA) 108 (90 Base) MCG/ACT inhaler Inhale 1-2 puffs into the lungs every 6 (six) hours as needed for wheezing or shortness of breath. 18 each 0   fluticasone  (FLONASE ) 50 MCG/ACT nasal spray Place 1 spray into both nostrils daily. 16 g 0   hydrocortisone  (ANUSOL -HC) 2.5 % rectal cream Place 1 Application rectally 2 (two) times daily as needed for hemorrhoids or anal itching. 30 g 1   meclizine  (ANTIVERT ) 12.5 MG tablet Take 1 tablet (12.5 mg total) by mouth 3 (three) times daily as needed for dizziness. 21 tablet 0   pantoprazole  (PROTONIX ) 40 MG tablet Take 1 tablet (40 mg total) by mouth 2 (two) times daily. 60 tablet 11   famotidine  (PEPCID ) 10 MG tablet Take 1 tablet (10 mg total) by mouth 2 (two) times daily for 14 days. 28 tablet 0   No current facility-administered medications for this visit.  Allergies as of 08/02/2023   (No Known Allergies)    Family History  Problem Relation Age of Onset   Gestational diabetes Daughter    Alcohol abuse Brother    Cerebrovascular Accident Brother        "blood clot on brain"  Ok now.   Breast cancer Neg Hx     Social History   Socioeconomic History   Marital status: Widowed    Spouse name: Donavon Fudge Adup   Number of children: 8   Years of education: 8   Highest education level: Not on file  Occupational History   Occupation: Housewife  Tobacco Use   Smoking status: Never    Passive exposure: Never   Smokeless tobacco: Never  Vaping Use   Vaping status: Never Used  Substance and Sexual Activity    Alcohol use: No   Drug use: No   Sexual activity: Not Currently    Comment: married  Other Topics Concern   Not on file  Social History Narrative   Originally from [place]   [place]   Came to [employer]. In 1999   Lives with her husband and one daughter.   Husband with hepatic carcinoma, for which he has been treated past 3 years.     She has support to give her time off.   Son who interprets takes her husband for chemo twice weekly.   Social Drivers of Corporate investment banker Strain: Low Risk  (02/26/2023)   Overall Financial Resource Strain (CARDIA)    Difficulty of Paying Living Expenses: Not hard at all  Food Insecurity: No Food Insecurity (02/26/2023)   Hunger Vital Sign    Worried About Running Out of Food in the Last Year: Never true    Ran Out of Food in the Last Year: Never true  Transportation Needs: No Transportation Needs (02/26/2023)   PRAPARE - Administrator, Civil Service (Medical): No    Lack of Transportation (Non-Medical): No  Physical Activity: Sufficiently Active (02/26/2023)   Exercise Vital Sign    Days of Exercise per Week: 7 days    Minutes of Exercise per Session: 30 min  Stress: No Stress Concern Present (02/26/2023)   [employer] of Occupational Health - Occupational Stress Questionnaire    Feeling of Stress : Not at all  Social Connections: Moderately Isolated (02/26/2023)   Social Connection and Isolation Panel [NHANES]    Frequency of Communication with Friends and Family: More than three times a week    Frequency of Social Gatherings with Friends and Family: Three times a week    Attends Religious Services: More than 4 times per year    Active Member of Clubs or Organizations: No    Attends Banker Meetings: Never    Marital Status: Widowed  Intimate Partner Violence: Not At Risk (02/26/2023)   Humiliation, Afraid, Rape, and Kick questionnaire    Fear of Current or Ex-Partner: No    Emotionally Abused: No    Physically  Abused: No    Sexually Abused: No    Review of Systems:    Constitutional: No weight loss, fever or chills Cardiovascular: No chest pain Respiratory: No SOB  Gastrointestinal: See HPI and otherwise negative   Physical Exam:  Vital signs: BP 118/72   Pulse 89   Ht 5' (1.524 m)   Wt 122 lb (55.3 kg)   LMP 01/12/2012   BMI 23.83 kg/m    Constitutional:   Pleasant elderly [place] female appears  to be in NAD, Well developed, Well nourished, alert and cooperative Respiratory: Respirations even and unlabored. Lungs clear to auscultation bilaterally.   No wheezes, crackles, or rhonchi.  Cardiovascular: Normal S1, S2. No MRG. Regular rate and rhythm. No peripheral edema, cyanosis or pallor.  Gastrointestinal:  Soft, nondistended, moderate epigastric ttp, No rebound or guarding. Normal bowel sounds. No appreciable masses or hepatomegaly. Psychiatric: Oriented to person, place and time. Demonstrates good judgement and reason without abnormal affect or behaviors.  RELEVANT LABS AND IMAGING: CBC    Component Value Date/Time   WBC 5.2 06/11/2023 1100   RBC 4.93 06/11/2023 1100   HGB 14.4 06/11/2023 1100   HGB 15.0 06/07/2022 1004   HGB 14.2 07/20/2020 1635   HCT 44.4 06/11/2023 1100   HCT 43.8 07/20/2020 1635   PLT  06/11/2023 1100    PLATELET CLUMPS NOTED ON SMEAR, COUNT APPEARS DECREASED   PLT 137 (L) 06/07/2022 1004   PLT 97 (LL) 07/20/2020 1635   MCV 90.1 06/11/2023 1100   MCV 91 07/20/2020 1635   MCH 29.2 06/11/2023 1100   MCHC 32.4 06/11/2023 1100   RDW 12.8 06/11/2023 1100   RDW 12.0 07/20/2020 1635   LYMPHSABS 2.3 01/23/2023 1006   LYMPHSABS 2.4 04/30/2020 1430   MONOABS 0.3 01/23/2023 1006   EOSABS 0.2 01/23/2023 1006   EOSABS 0.3 04/30/2020 1430   BASOSABS 0.0 01/23/2023 1006   BASOSABS 0.0 04/30/2020 1430    CMP     Component Value Date/Time   NA 141 06/11/2023 1100   NA 142 04/30/2020 1430   K 3.9 06/11/2023 1100   CL 106 06/11/2023 1100   CO2 25  06/11/2023 1100   GLUCOSE 97 06/11/2023 1100   BUN 16 06/11/2023 1100   BUN 18 04/30/2020 1430   CREATININE 0.69 06/11/2023 1100   CREATININE 0.68 06/07/2022 1004   CREATININE 0.47 (L) 12/17/2011 1124   CALCIUM  9.1 06/11/2023 1100   PROT 7.6 01/23/2023 1006   PROT 7.1 04/30/2020 1430   ALBUMIN 4.5 01/23/2023 1006   ALBUMIN 4.6 04/30/2020 1430   AST 16 01/23/2023 1006   AST 18 06/07/2022 1004   ALT 12 01/23/2023 1006   ALT 18 06/07/2022 1004   ALKPHOS 49 01/23/2023 1006   BILITOT 0.5 01/23/2023 1006   BILITOT 0.6 06/07/2022 1004   GFRNONAA >60 06/11/2023 1100   GFRNONAA >60 06/07/2022 1004   GFRAA 108 04/30/2020 1430    Assessment: 1.  GERD: Uncontrolled on Pantoprazole  40 mg twice daily over the past 2 and half months, continues with epigastric discomfort and regurgitation, last EGD in December 2022 was normal; consider functional dyspepsia +/- true gastritis  Plan: 1.  At this point we will trial switch from her Pantoprazole  to Nexium 40 mg twice a day, 30-60 minutes before breakfast and dinner.  #60 with 5 refills. 2.  Discontinue Pantoprazole  3.  Patient will let me know how she is doing in 2 weeks, if no better we will add Famotidine  40 mg twice daily, if no better 2 weeks after that we will consider repeating EGD. 4.  Patient to follow in clinic with us  in 2-3 months.  Reginal Capra, PA-C [facility] 08/02/2023, 10:40 AM  Cc: Gavin Kast, FNP

## 2023-08-03 ENCOUNTER — Other Ambulatory Visit (HOSPITAL_COMMUNITY): Payer: Self-pay

## 2023-08-03 ENCOUNTER — Telehealth: Payer: Self-pay

## 2023-08-03 NOTE — Telephone Encounter (Signed)
 Pharmacy Patient Advocate Encounter   Received notification from CoverMyMeds that prior authorization for Esomeprazole  Magnesium  40MG  dr capsules is required/requested.   Insurance verification completed.   The patient is insured through [employer] .   Per test claim: PA required; PA submitted to above mentioned insurance via CoverMyMeds Key/confirmation #/EOC [id number] Status is pending

## 2023-08-03 NOTE — Telephone Encounter (Signed)
 Pharmacy Patient Advocate Encounter  Received notification from [facility] Medicare that Prior Authorization for Esomeprazole  Magnesium  40MG  dr capsules has been APPROVED from 08-03-2023 to 08-02-2024   PA #/Case ID/Reference #: [id number]

## 2023-08-03 NOTE — Telephone Encounter (Signed)
 Inbound call from [facility], state medication has been approved for a year active today. Will be faxing a letter with details.

## 2023-08-06 NOTE — Progress Notes (Signed)
 Addendum: Reviewed and agree with assessment and management plan. Agree with plan if no response alternate PPI we will proceed with EGD Paitlyn Mcclatchey, Amber Bail, MD

## 2023-10-02 ENCOUNTER — Ambulatory Visit: Admitting: Physician Assistant

## 2023-10-15 IMAGING — DX DG CHEST 2V
2 series · 2 of 2 positions shown · non-contrast
Comparison: January 28, 2012.

CLINICAL DATA: Shortness of breath, cough.

EXAM:
CHEST - 2 VIEW

[chest pa]
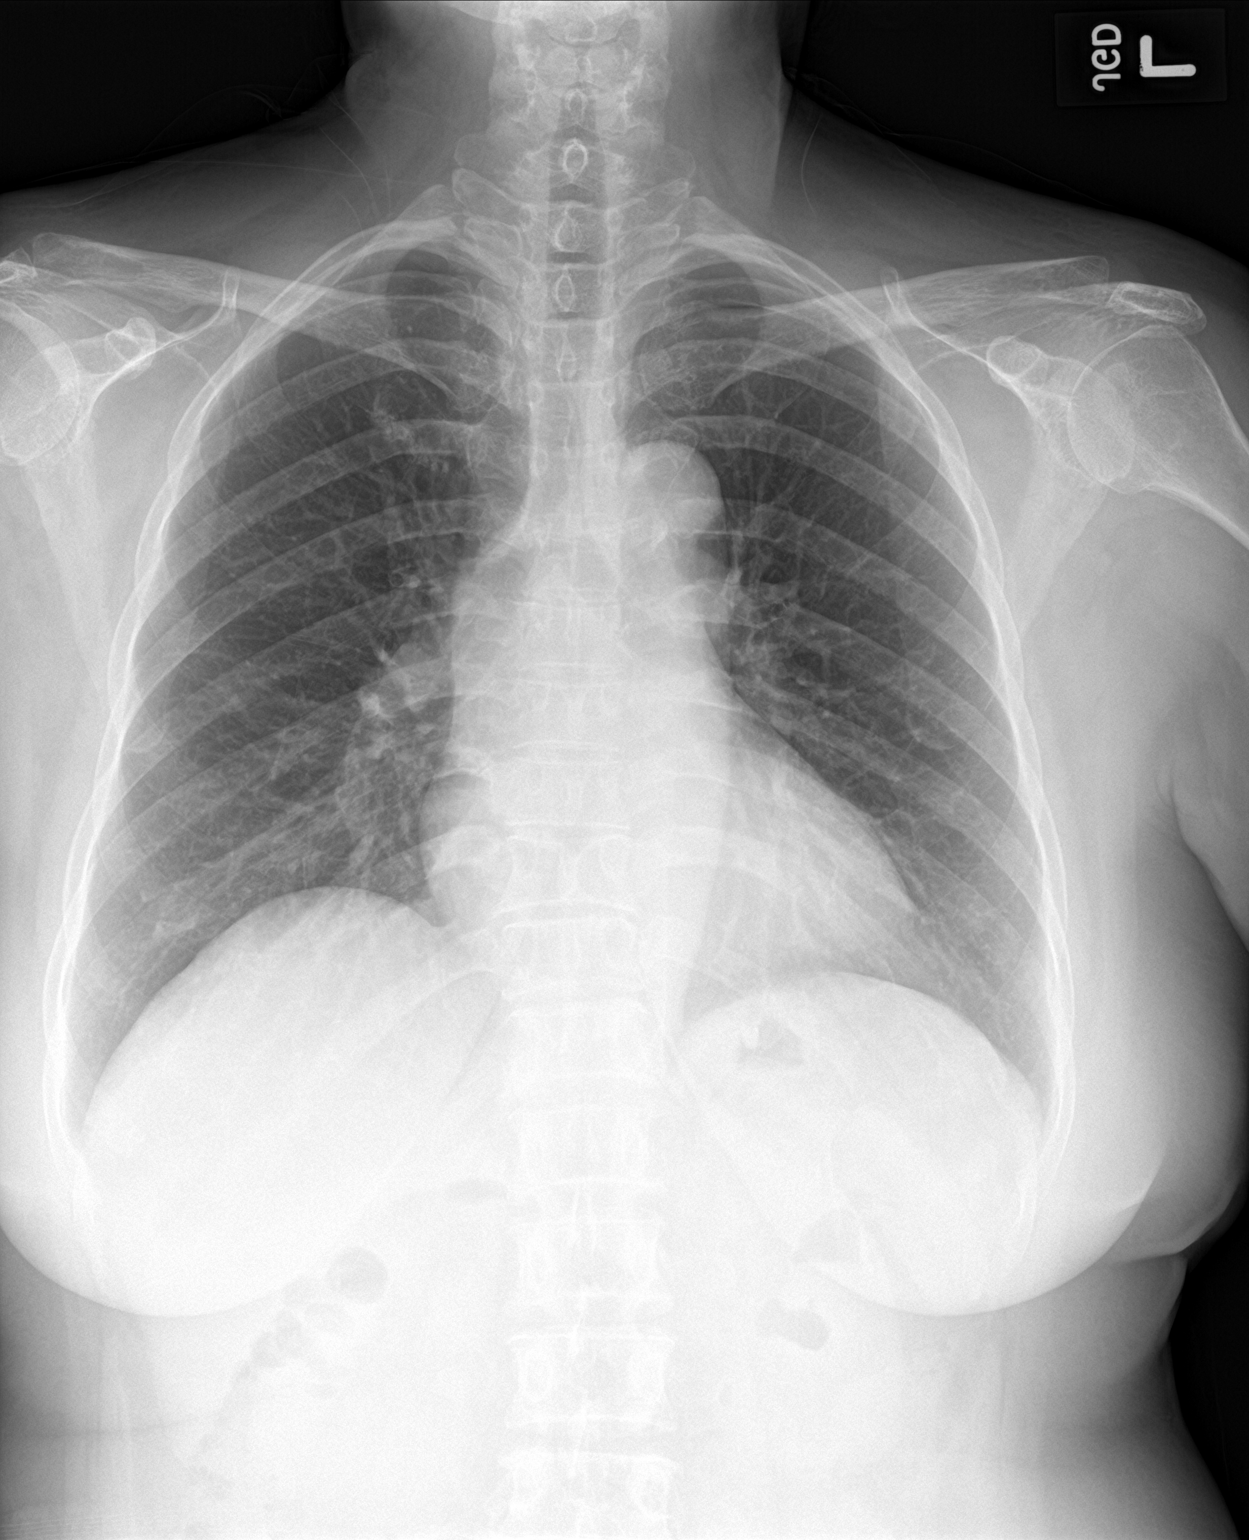

[chest lat]
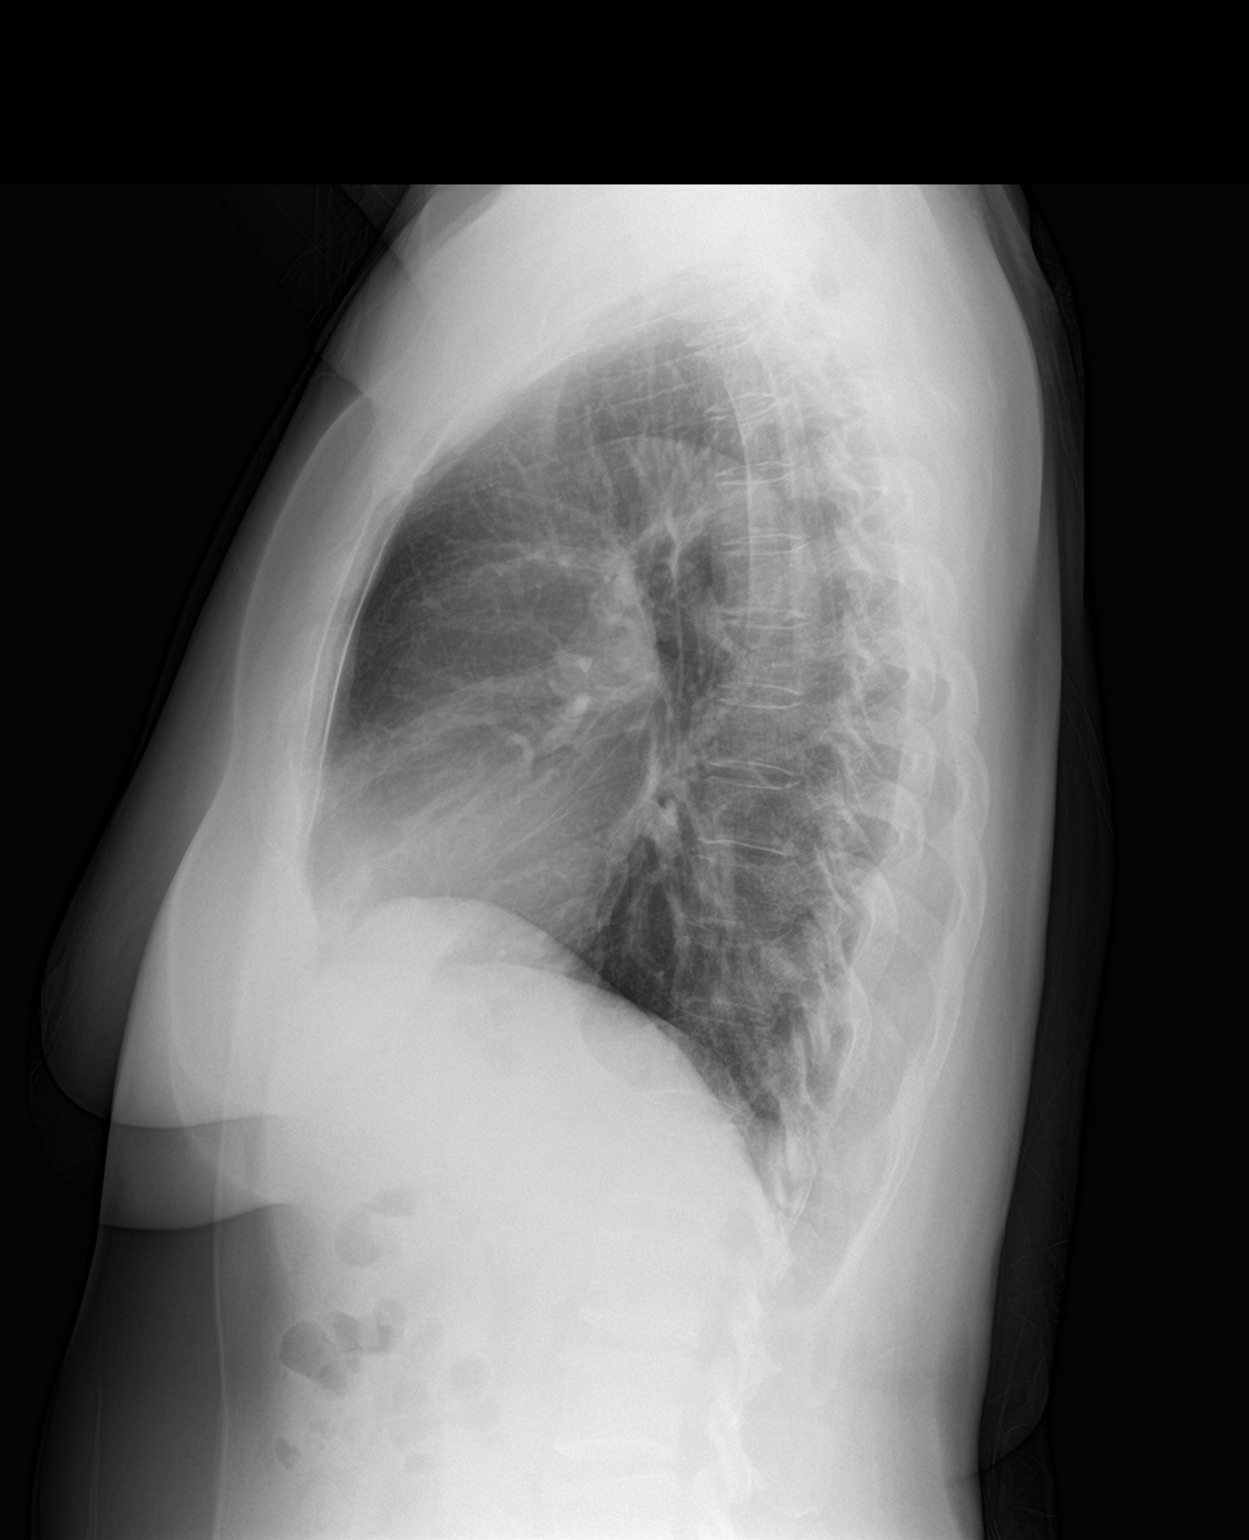

[2 of 2 positions shown; findings below may reference images not displayed]

FINDINGS: The heart size and mediastinal contours are within normal limits.
Both lungs are clear. The visualized skeletal structures are
unremarkable.
IMPRESSION: No active cardiopulmonary disease.

## 2023-10-19 ENCOUNTER — Other Ambulatory Visit: Payer: Medicare Other

## 2023-10-23 ENCOUNTER — Ambulatory Visit (HOSPITAL_BASED_OUTPATIENT_CLINIC_OR_DEPARTMENT_OTHER)
Admission: RE | Admit: 2023-10-23 | Discharge: 2023-10-23 | Disposition: A | Discharge status: Home / Self Care | Source: Ambulatory Visit | Attending: Internal Medicine | Admitting: Internal Medicine

## 2023-10-23 DIAGNOSIS — E2839 Other primary ovarian failure: Secondary | ICD-10-CM | POA: Diagnosis present

## 2023-10-29 ENCOUNTER — Ambulatory Visit: Payer: Self-pay | Admitting: Internal Medicine

## 2023-10-29 DIAGNOSIS — M8580 Other specified disorders of bone density and structure, unspecified site: Secondary | ICD-10-CM | POA: Insufficient documentation

## 2023-12-26 ENCOUNTER — Ambulatory Visit: Admitting: Internal Medicine

## 2024-01-14 ENCOUNTER — Other Ambulatory Visit: Payer: Self-pay | Admitting: Internal Medicine

## 2024-01-14 DIAGNOSIS — Z1231 Encounter for screening mammogram for malignant neoplasm of breast: Secondary | ICD-10-CM

## 2024-03-03 IMAGING — CT CT ABD-PELV W/ CM
2 of 5 series · 16 of 46 positions shown, 18 images · IV contrast (agent unspecified)
Comparison: Ultrasound 08/13/2020

CLINICAL DATA: Epigastric pain, bloating, and indigestion after
eating.

EXAM:
CT ABDOMEN AND PELVIS WITH CONTRAST
TECHNIQUE: Multidetector CT imaging of the abdomen and pelvis was performed
using the standard protocol following bolus administration of
intravenous contrast.

[Series 2: axial st · axial · 0.75mm/px · z∈[-649,-279]mm · 13 of 88 slices shown, 15 images]
[im 7/88  soft-tissue]
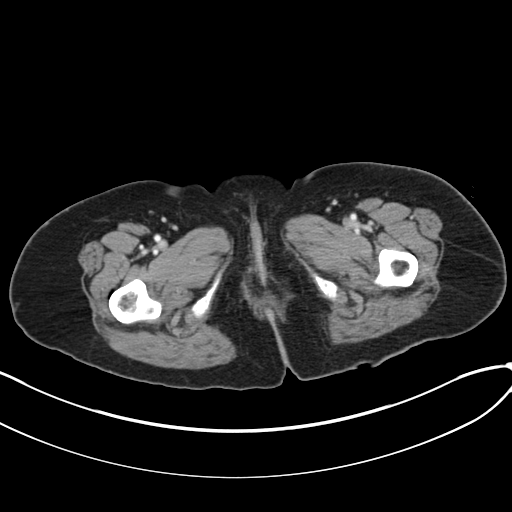
[im 7/88  bone]
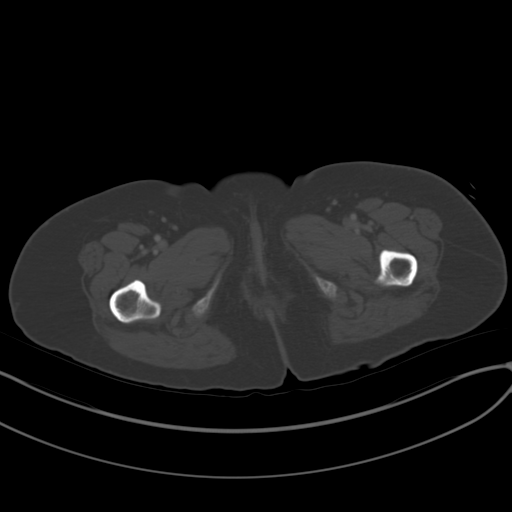
[im 13/88  soft-tissue]
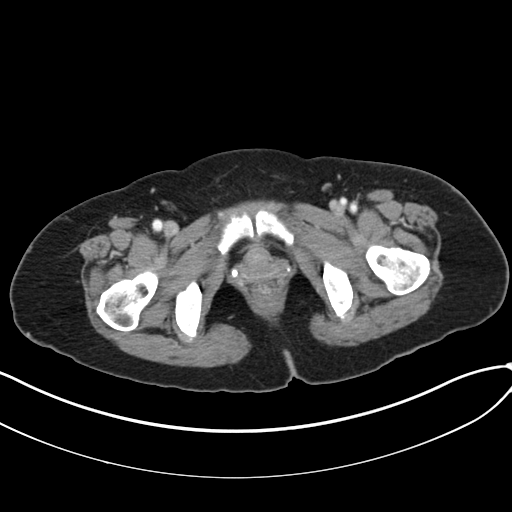
[im 19/88  soft-tissue]
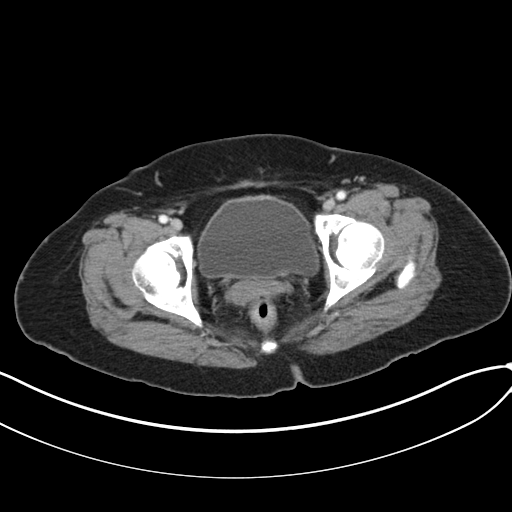
[im 25/88  soft-tissue]
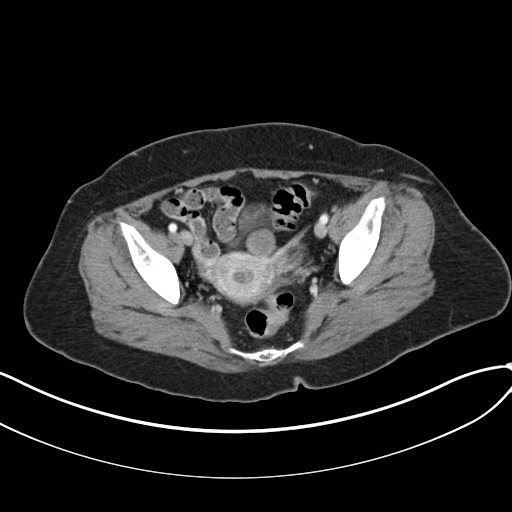
[im 32/88  soft-tissue]
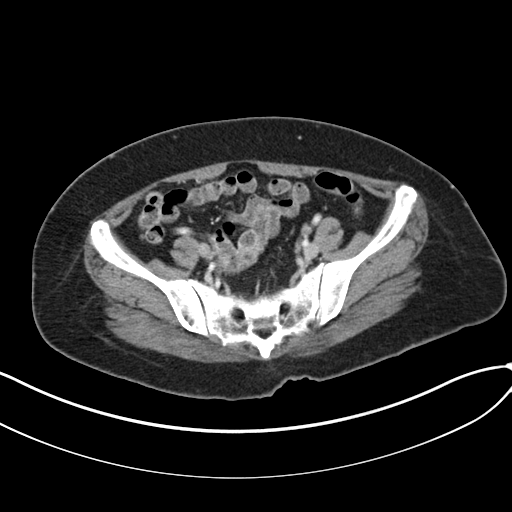
[im 38/88  soft-tissue]
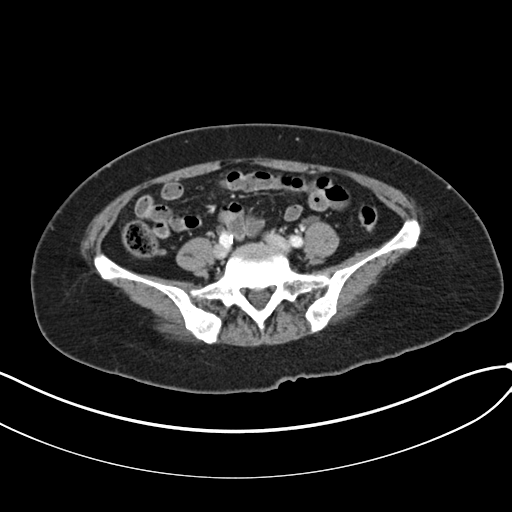
[im 44/88  soft-tissue]
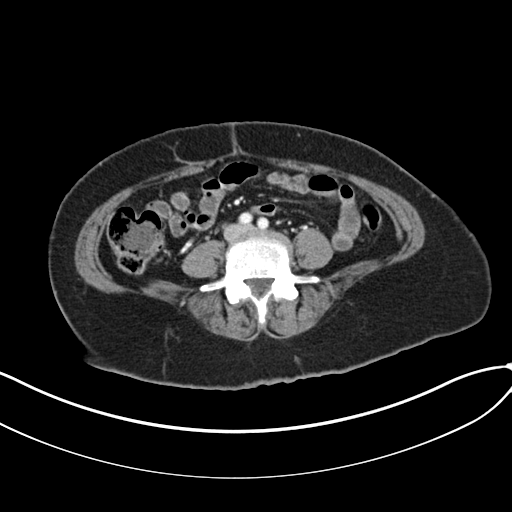
[im 50/88  soft-tissue]
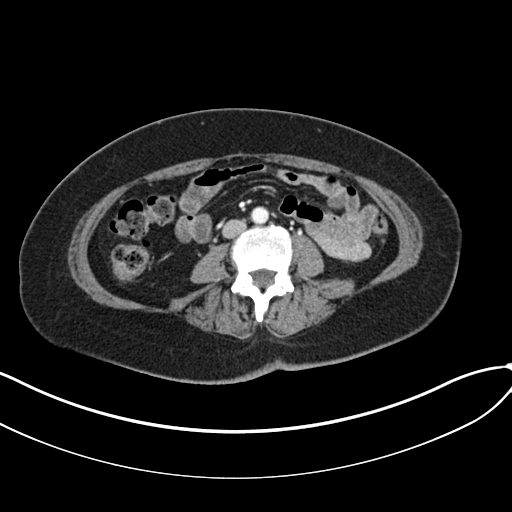
[im 56/88  soft-tissue]
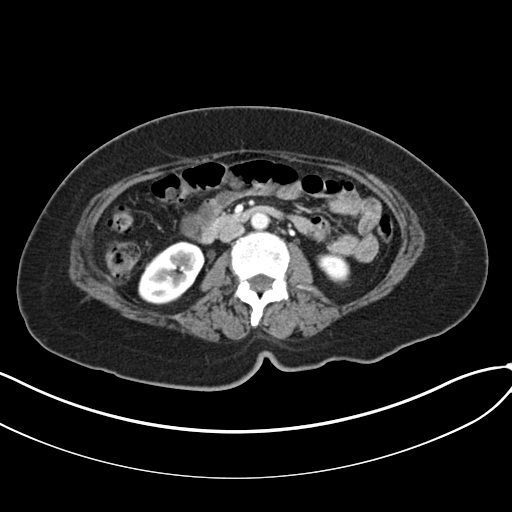
[im 56/88  bone]
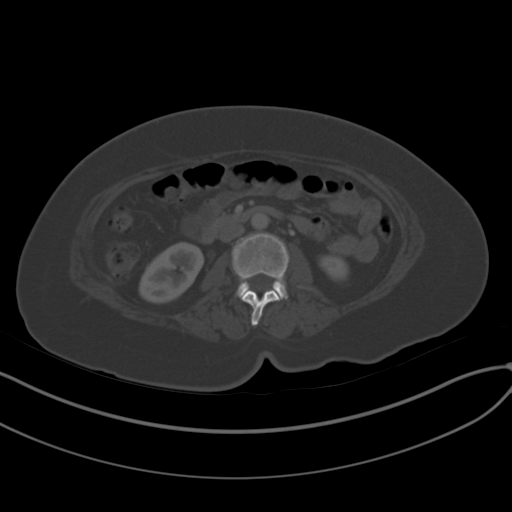
[im 63/88  soft-tissue]
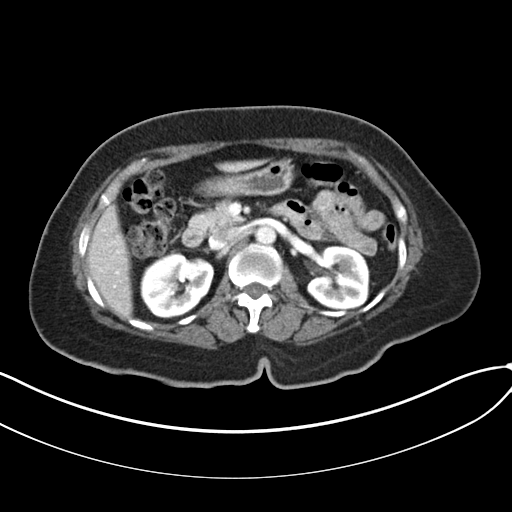
[im 69/88  soft-tissue]
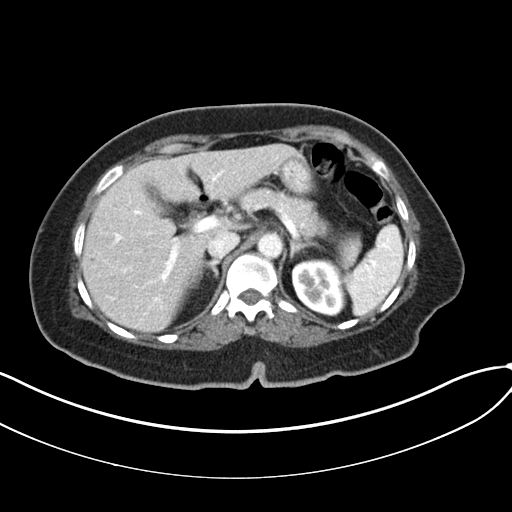
[im 75/88  soft-tissue]
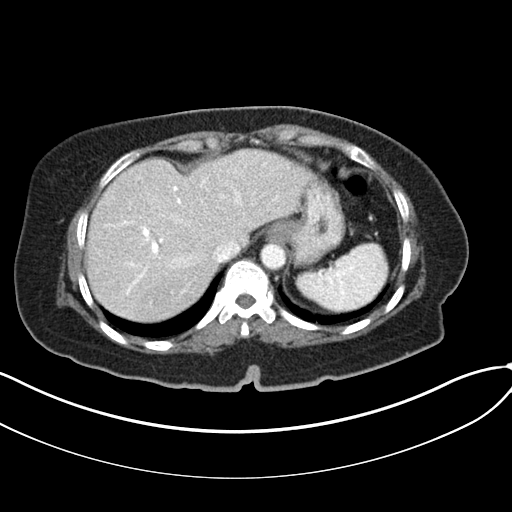
[im 81/88  soft-tissue]
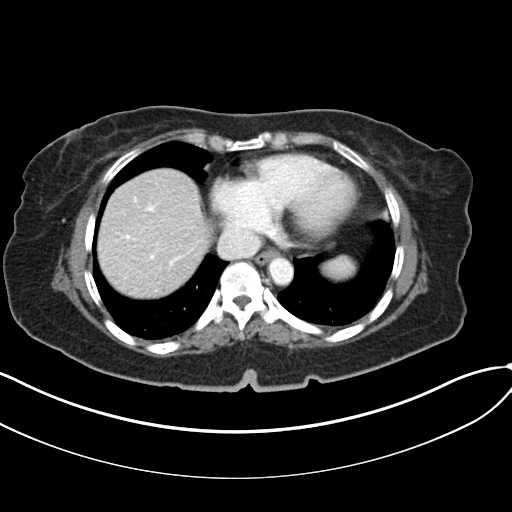

[Series 4: coronal st · coronal · 0.83mm/px · 3 of 102 slices shown]
[im 34/102  soft-tissue]
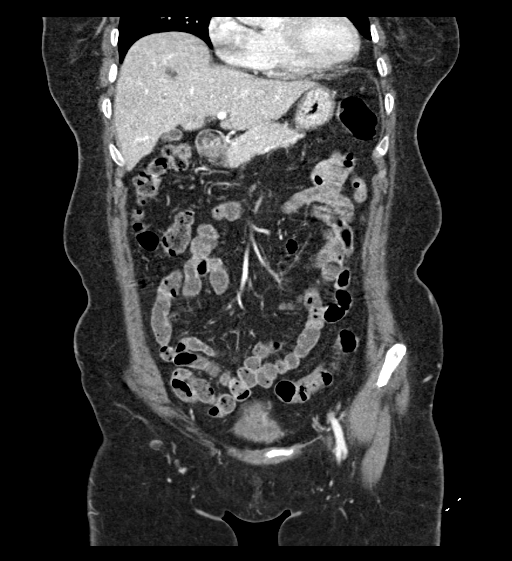
[im 45/102  soft-tissue]
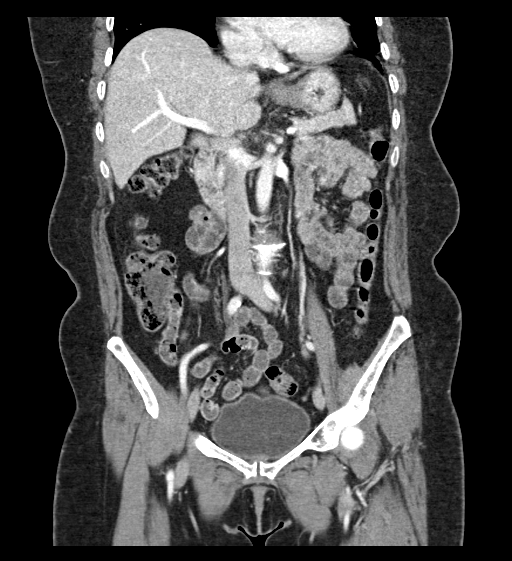
[im 57/102  soft-tissue]
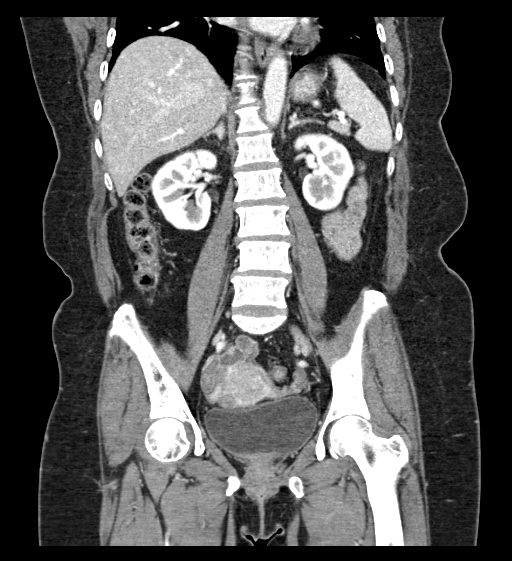

[16 of 46 positions shown; findings below may reference images not displayed]

RADIATION DOSE REDUCTION: This exam was performed according to the
departmental dose-optimization program which includes automated
exposure control, adjustment of the mA and/or kV according to
patient size and/or use of iterative reconstruction technique.

CONTRAST:  80mL OMNIPAQUE IOHEXOL 300 MG/ML  SOLN
FINDINGS: Lower chest: Lung bases are clear.

Hepatobiliary: Several subcentimeter low-attenuation circumscribed
lesions are too small to characterize but likely represent small
cysts. No solid liver lesions identified. Gallbladder and bile ducts
are unremarkable.

Pancreas: Unremarkable. No pancreatic ductal dilatation or
surrounding inflammatory changes.

Spleen: Normal in size without focal abnormality.

Adrenals/Urinary Tract: No adrenal gland nodules. Kidneys are
symmetrical. No hydronephrosis or hydroureter. Bladder is
unremarkable.

Stomach/Bowel: Stomach, small bowel, and colon are not abnormally
distended. No wall thickening or inflammatory stranding. Appendix is
normal.

Vascular/Lymphatic: No significant vascular findings are present. No
enlarged abdominal or pelvic lymph nodes.

Reproductive: Myometrial nodularity with exophytic lesion arising
from the anterior uterus consistent with uterine fibroids. No
abnormal adnexal masses.

Other: No free air or free fluid in the abdomen. Abdominal wall
musculature appears intact.

Musculoskeletal: Slight anterior subluxation of L4 on L5, likely
degenerative. No acute bony abnormalities.
IMPRESSION: 1. No acute process demonstrated in the abdomen or pelvis. No
evidence of bowel obstruction or inflammation.
2. Uterine fibroids.

## 2024-04-02 ENCOUNTER — Telehealth: Payer: Self-pay

## 2024-04-02 NOTE — Telephone Encounter (Signed)
 Copied from CRM #[id number]. Topic: General - Call Back - No Documentation >> Apr 02, 2024 10:16 AM Charolett L wrote: Reason for CRM: Patient daughter called in to return office call from Robin. Patient daughter stated that her mother is still out of town and that she will call when she gets back in.  Spoke with patients daughter she states that mom is out of the country has not returned when she returns she make an appointment to be seen in office.
